# Patient Record
Sex: Female | Born: 1990 | Hispanic: No | Marital: Married | Smoking: Never smoker
Health system: Southern US, Community
[De-identification: ages and names within clinical notes are randomized; demographics above are authoritative.]

## PROBLEM LIST (undated history)

## (undated) DIAGNOSIS — Z789 Other specified health status: Secondary | ICD-10-CM

---
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
## 2008-03-31 ENCOUNTER — Other Ambulatory Visit: Admission: RE | Admit: 2008-03-31 | Discharge: 2008-03-31 | Payer: Self-pay | Admitting: Obstetrics and Gynecology

## 2012-05-16 LAB — HM PAP SMEAR

## 2012-09-25 HISTORY — PX: WISDOM TOOTH EXTRACTION: SHX21

## 2013-01-23 HISTORY — PX: WISDOM TOOTH EXTRACTION: SHX21

## 2013-05-16 ENCOUNTER — Ambulatory Visit: Payer: Self-pay | Admitting: Nurse Practitioner

## 2013-05-23 ENCOUNTER — Encounter: Payer: Self-pay | Admitting: Nurse Practitioner

## 2013-05-23 ENCOUNTER — Ambulatory Visit (INDEPENDENT_AMBULATORY_CARE_PROVIDER_SITE_OTHER): Payer: BC Managed Care – PPO | Admitting: Nurse Practitioner

## 2013-05-23 VITALS — BP 108/66 | HR 84 | Resp 16 | Ht 62.75 in | Wt 111.0 lb

## 2013-05-23 DIAGNOSIS — Z01419 Encounter for gynecological examination (general) (routine) without abnormal findings: Secondary | ICD-10-CM

## 2013-05-23 DIAGNOSIS — Z0000 Encounter for general adult medical examination without abnormal findings: Secondary | ICD-10-CM

## 2013-05-23 NOTE — Progress Notes (Signed)
Patient ID: Alexa Hudson, female   DOB: 1991/01/03, 22 y.o.   MRN: [id number] 22 y.o. G0P0 Married Caucasian Fe here for annual exam. Menses regular lasting 5 days. Got married last October 2013. Not planning pregnancy this year.  Still working as Architect at [employer].  Patient's last menstrual period was 05/07/2013.          Sexually active: yes  The current method of family planning is condoms all the time.    Exercising: no  The patient does not participate in regular exercise at present. Smoker:  no  Health Maintenance: Pap:  05/17/12, WNL TDaP:  01/21/07 Gardasil completed 09/11/08 Labs: HB declined  Urine: negative, 5.0 pH   reports that she has never smoked. She has never used smokeless tobacco. She reports that she does not drink alcohol or use illicit drugs.  History reviewed. No pertinent past medical history.  Past Surgical History  Procedure Laterality Date  . Wisdom tooth extraction      No current outpatient prescriptions on file.   No current facility-administered medications for this visit.    Family History  Problem Relation Age of Onset  . Cancer Other 104    uterine cancer    ROS:  Pertinent items are noted in HPI.  Otherwise, a comprehensive ROS was negative.  Exam:   BP 108/66  Pulse 84  Resp 16  Ht 5' 2.75" (1.594 m)  Wt 111 lb (50.349 kg)  BMI 19.82 kg/m2  LMP 05/07/2013 Height: 5' 2.75" (159.4 cm)  Ht Readings from Last 3 Encounters:  05/23/13 5' 2.75" (1.594 m)  wt: 116 2013 Wt: 124 2012 Wt. 108 2010  General appearance: alert, cooperative and appears stated age Head: Normocephalic, without obvious abnormality, atraumatic Neck: no adenopathy, supple, symmetrical, trachea midline and thyroid normal to inspection and palpation Lungs: clear to auscultation bilaterally Breasts: normal appearance, no masses or tenderness Heart: regular rate and rhythm Abdomen: soft, non-tender; no masses,  no  organomegaly Extremities: extremities normal, atraumatic, no cyanosis or edema Skin: Skin color, texture, turgor normal. No rashes or lesions Lymph nodes: Cervical, supraclavicular, and axillary nodes normal. No abnormal inguinal nodes palpated Neurologic: Grossly normal   Pelvic: External genitalia:  no lesions              Urethra:  normal appearing urethra with no masses, tenderness or lesions              Bartholin's and Skene's: normal                 Vagina: normal appearing vagina with normal color and discharge, no lesions              Cervix: anteverted              Pap taken: no Bimanual Exam:  Uterus:  normal size, contour, position, consistency, mobility, non-tender              Adnexa: no mass, fullness, tenderness               Rectovaginal: Confirms               Anus:  normal sphincter tone, no lesions  A:  Well Woman with normal exam  Condoms for birth control  P:   Pap smear as per guidelines   Counseled on breast self exam, adequate intake of calcium and vitamin D, diet and exercise return annually or prn  An After Visit Summary was printed  and given to the patient.

## 2013-05-23 NOTE — Patient Instructions (Signed)
General topics  Next pap or exam is  due in 1 year Take a Women's multivitamin Take 1200 mg. of calcium daily - prefer dietary If any concerns in interim to call back  Breast Self-Awareness Practicing breast self-awareness may pick up problems early, prevent significant medical complications, and possibly save your life. By practicing breast self-awareness, you can become familiar with how your breasts look and feel and if your breasts are changing. This allows you to notice changes early. It can also offer you some reassurance that your breast health is good. One way to learn what is normal for your breasts and whether your breasts are changing is to do a breast self-exam. If you find a lump or something that was not present in the past, it is best to contact your caregiver right away. Other findings that should be evaluated by your caregiver include nipple discharge, especially if it is bloody; skin changes or reddening; areas where the skin seems to be pulled in (retracted); or new lumps and bumps. Breast pain is seldom associated with cancer (malignancy), but should also be evaluated by a caregiver. BREAST SELF-EXAM The best time to examine your breasts is 5 7 days after your menstrual period is over.  ExitCare Patient Information 2013 [employer].   Exercise to Stay Healthy Exercise helps you become and stay healthy. EXERCISE IDEAS AND TIPS Choose exercises that:  You enjoy.  Fit into your day. You do not need to exercise really hard to be healthy. You can do exercises at a slow or medium level and stay healthy. You can:  Stretch before and after working out.  Try yoga, Pilates, or tai chi.  Lift weights.  Walk fast, swim, jog, run, climb stairs, bicycle, dance, or rollerskate.  Take aerobic classes. Exercises that burn about 150 calories:  Running 1  miles in 15 minutes.  Playing volleyball for 45 to 60 minutes.  Washing and waxing a car for 45 to 60  minutes.  Playing touch football for 45 minutes.  Walking 1  miles in 35 minutes.  Pushing a stroller 1  miles in 30 minutes.  Playing basketball for 30 minutes.  Raking leaves for 30 minutes.  Bicycling 5 miles in 30 minutes.  Walking 2 miles in 30 minutes.  Dancing for 30 minutes.  Shoveling snow for 15 minutes.  Swimming laps for 20 minutes.  Walking up stairs for 15 minutes.  Bicycling 4 miles in 15 minutes.  Gardening for 30 to 45 minutes.  Jumping rope for 15 minutes.  Washing windows or floors for 45 to 60 minutes. Document Released: 10/14/2010 Document Revised: 12/04/2011 Document Reviewed: 10/14/2010 ExitCare Patient Information 2013 [employer].   Other topics ( that may be useful information):    Sexually Transmitted Disease Sexually transmitted disease (STD) refers to any infection that is passed from person to person during sexual activity. This may happen by way of saliva, semen, blood, vaginal mucus, or urine. Common STDs include:  Gonorrhea.  Chlamydia.  Syphilis.  HIV/AIDS.  Genital herpes.  Hepatitis B and C.  Trichomonas.  Human papillomavirus (HPV).  Pubic lice. CAUSES  An STD may be spread by bacteria, virus, or parasite. A person can get an STD by:  Sexual intercourse with an infected person.  Sharing sex toys with an infected person.  Sharing needles with an infected person.  Having intimate contact with the genitals, mouth, or rectal areas of an infected person. SYMPTOMS  Some people may not have any symptoms, but   they can still pass the infection to others. Different STDs have different symptoms. Symptoms include:  Painful or bloody urination.  Pain in the pelvis, abdomen, vagina, anus, throat, or eyes.  Skin rash, itching, irritation, growths, or sores (lesions). These usually occur in the genital or anal area.  Abnormal vaginal discharge.  Penile discharge in men.  Soft, flesh-colored skin growths in the  genital or anal area.  Fever.  Pain or bleeding during sexual intercourse.  Swollen glands in the groin area.  Yellow skin and eyes (jaundice). This is seen with hepatitis. DIAGNOSIS  To make a diagnosis, your caregiver may:  Take a medical history.  Perform a physical exam.  Take a specimen (culture) to be examined.  Examine a sample of discharge under a microscope.  Perform blood test TREATMENT   Chlamydia, gonorrhea, trichomonas, and syphilis can be cured with antibiotic medicine.  Genital herpes, hepatitis, and HIV can be treated, but not cured, with prescribed medicines. The medicines will lessen the symptoms.  Genital warts from HPV can be treated with medicine or by freezing, burning (electrocautery), or surgery. Warts may come back.  HPV is a virus and cannot be cured with medicine or surgery.However, abnormal areas may be followed very closely by your caregiver and may be removed from the cervix, vagina, or vulva through office procedures or surgery. If your diagnosis is confirmed, your recent sexual partners need treatment. This is true even if they are symptom-free or have a negative culture or evaluation. They should not have sex until their caregiver says it is okay. HOME CARE INSTRUCTIONS  All sexual partners should be informed, tested, and treated for all STDs.  Take your antibiotics as directed. Finish them even if you start to feel better.  Only take over-the-counter or prescription medicines for pain, discomfort, or fever as directed by your caregiver.  Rest.  Eat a balanced diet and drink enough fluids to keep your urine clear or pale yellow.  Do not have sex until treatment is completed and you have followed up with your caregiver. STDs should be checked after treatment.  Keep all follow-up appointments, Pap tests, and blood tests as directed by your caregiver.  Only use latex condoms and water-soluble lubricants during sexual activity. Do not use  petroleum jelly or oils.  Avoid alcohol and illegal drugs.  Get vaccinated for HPV and hepatitis. If you have not received these vaccines in the past, talk to your caregiver about whether one or both might be right for you.  Avoid risky sex practices that can break the skin. The only way to avoid getting an STD is to avoid all sexual activity.Latex condoms and dental dams (for oral sex) will help lessen the risk of getting an STD, but will not completely eliminate the risk. SEEK MEDICAL CARE IF:   You have a fever.  You have any new or worsening symptoms. Document Released: 12/02/2002 Document Revised: 12/04/2011 Document Reviewed: 12/09/2010 ExitCare Patient Information 2013 [employer].    Domestic Abuse You are being battered or abused if someone close to you hits, pushes, or physically hurts you in any way. You also are being abused if you are forced into activities. You are being sexually abused if you are forced to have sexual contact of any kind. You are being emotionally abused if you are made to feel worthless or if you are constantly threatened. It is important to remember that help is available. No one has the right to abuse you. PREVENTION OF FURTHER   ABUSE  Learn the warning signs of danger. This varies with situations but may include: the use of alcohol, threats, isolation from friends and family, or forced sexual contact. Leave if you feel that violence is going to occur.  If you are attacked or beaten, report it to the police so the abuse is documented. You do not have to press charges. The police can protect you while you or the attackers are leaving. Get the officer's name and badge number and a copy of the report.  Find someone you can trust and tell them what is happening to you: your caregiver, a nurse, clergy member, close friend or family member. Feeling ashamed is natural, but remember that you have done nothing wrong. No one deserves abuse. Document Released:  09/08/2000 Document Revised: 12/04/2011 Document Reviewed: 11/17/2010 ExitCare Patient Information 2013 [employer].    How Much is Too Much Alcohol? Drinking too much alcohol can cause injury, accidents, and health problems. These types of problems can include:   Car crashes.  Falls.  Family fighting (domestic violence).  Drowning.  Fights.  Injuries.  Burns.  Damage to certain organs.  Having a baby with birth defects. ONE DRINK CAN BE TOO MUCH WHEN YOU ARE:  Working.  Pregnant or breastfeeding.  Taking medicines. Ask your doctor.  Driving or planning to drive. If you or someone you know has a drinking problem, get help from a doctor.  Document Released: 07/08/2009 Document Revised: 12/04/2011 Document Reviewed: 07/08/2009 ExitCare Patient Information 2013 [employer].   Smoking Hazards Smoking cigarettes is extremely bad for your health. Tobacco smoke has over 200 known poisons in it. There are over 60 chemicals in tobacco smoke that cause cancer. Some of the chemicals found in cigarette smoke include:   Cyanide.  Benzene.  Formaldehyde.  Methanol (wood alcohol).  Acetylene (fuel used in welding torches).  Ammonia. Cigarette smoke also contains the poisonous gases nitrogen oxide and carbon monoxide.  Cigarette smokers have an increased risk of many serious medical problems and Smoking causes approximately:  90% of all lung cancer deaths in men.  80% of all lung cancer deaths in women.  90% of deaths from chronic obstructive lung disease. Compared with nonsmokers, smoking increases the risk of:  Coronary heart disease by 2 to 4 times.  Stroke by 2 to 4 times.  Men developing lung cancer by 23 times.  Women developing lung cancer by 13 times.  Dying from chronic obstructive lung diseases by 12 times.  . Smoking is the most preventable cause of death and disease in our society.  WHY IS SMOKING ADDICTIVE?  Nicotine is the chemical  agent in tobacco that is capable of causing addiction or dependence.  When you smoke and inhale, nicotine is absorbed rapidly into the bloodstream through your lungs. Nicotine absorbed through the lungs is capable of creating a powerful addiction. Both inhaled and non-inhaled nicotine may be addictive.  Addiction studies of cigarettes and spit tobacco show that addiction to nicotine occurs mainly during the teen years, when young people begin using tobacco products. WHAT ARE THE BENEFITS OF QUITTING?  There are many health benefits to quitting smoking.   Likelihood of developing cancer and heart disease decreases. Health improvements are seen almost immediately.  Blood pressure, pulse rate, and breathing patterns start returning to normal soon after quitting. QUITTING SMOKING   American Lung Association - 1-800-LUNGUSA  American Cancer Society - 1-800-ACS-2345 Document Released: 10/19/2004 Document Revised: 12/04/2011 Document Reviewed: 06/23/2009 ExitCare Patient Information 2013 [employer].   Stress Management Stress is a state of physical or mental tension that often results from changes in your life or normal routine. Some common causes of stress are:  Death of a loved one.  Injuries or severe illnesses.  Getting fired or changing jobs.  Moving into a new home. Other causes may be:  Sexual problems.  Business or financial losses.  Taking on a large debt.  Regular conflict with someone at home or at work.  Constant tiredness from lack of sleep. It is not just bad things that are stressful. It may be stressful to:  Win the lottery.  Get married.  Buy a new car. The amount of stress that can be easily tolerated varies from person to person. Changes generally cause stress, regardless of the types of change. Too much stress can affect your health. It may lead to physical or emotional problems. Too little stress (boredom) may also become stressful. SUGGESTIONS TO  REDUCE STRESS:  Talk things over with your family and friends. It often is helpful to share your concerns and worries. If you feel your problem is serious, you may want to get help from a professional counselor.  Consider your problems one at a time instead of lumping them all together. Trying to take care of everything at once may seem impossible. List all the things you need to do and then start with the most important one. Set a goal to accomplish 2 or 3 things each day. If you expect to do too many in a single day you will naturally fail, causing you to feel even more stressed.  Do not use alcohol or drugs to relieve stress. Although you may feel better for a short time, they do not remove the problems that caused the stress. They can also be habit forming.  Exercise regularly - at least 3 times per week. Physical exercise can help to relieve that "uptight" feeling and will relax you.  The shortest distance between despair and hope is often a good night's sleep.  Go to bed and get up on time allowing yourself time for appointments without being rushed.  Take a short "time-out" period from any stressful situation that occurs during the day. Close your eyes and take some deep breaths. Starting with the muscles in your face, tense them, hold it for a few seconds, then relax. Repeat this with the muscles in your neck, shoulders, hand, stomach, back and legs.  Take good care of yourself. Eat a balanced diet and get plenty of rest.  Schedule time for having fun. Take a break from your daily routine to relax. HOME CARE INSTRUCTIONS   Call if you feel overwhelmed by your problems and feel you can no longer manage them on your own.  Return immediately if you feel like hurting yourself or someone else. Document Released: 03/07/2001 Document Revised: 12/04/2011 Document Reviewed: 10/28/2007 ExitCare Patient Information 2013 [employer].   

## 2013-05-26 NOTE — Progress Notes (Signed)
Encounter reviewed by Dr. Sharissa Brierley Silva.  

## 2013-11-12 ENCOUNTER — Telehealth: Payer: Self-pay | Admitting: Nurse Practitioner

## 2013-11-12 ENCOUNTER — Encounter: Payer: Self-pay | Admitting: Certified Nurse Midwife

## 2013-11-12 ENCOUNTER — Ambulatory Visit (INDEPENDENT_AMBULATORY_CARE_PROVIDER_SITE_OTHER): Payer: BC Managed Care – PPO | Admitting: Certified Nurse Midwife

## 2013-11-12 VITALS — BP 116/78 | HR 82 | Temp 98.3°F | Resp 16 | Ht 62.75 in | Wt 111.0 lb

## 2013-11-12 DIAGNOSIS — B3731 Acute candidiasis of vulva and vagina: Secondary | ICD-10-CM

## 2013-11-12 DIAGNOSIS — N390 Urinary tract infection, site not specified: Secondary | ICD-10-CM

## 2013-11-12 DIAGNOSIS — B373 Candidiasis of vulva and vagina: Secondary | ICD-10-CM

## 2013-11-12 LAB — POCT URINALYSIS DIPSTICK
BILIRUBIN UA: NEGATIVE
Glucose, UA: NEGATIVE
KETONES UA: NEGATIVE
Nitrite, UA: NEGATIVE
PH UA: 5
Protein, UA: NEGATIVE
RBC UA: NEGATIVE
Urobilinogen, UA: NEGATIVE

## 2013-11-12 MED ORDER — FLUCONAZOLE 150 MG PO TABS: 150.0000 mg | ORAL_TABLET | Freq: Once | ORAL | Status: DC

## 2013-11-12 MED ORDER — NITROFURANTOIN MONOHYD MACRO 100 MG PO CAPS: 100.0000 mg | ORAL_CAPSULE | Freq: Two times a day (BID) | ORAL | Status: DC

## 2013-11-12 NOTE — Progress Notes (Signed)
   Subjective:    Patient ID: Alexa Hudson, white female [id number] DOB: [birth year], 23 y.o.   MRN: [id number]  Urinary Tract Infection  This is a new problem. The current episode started in the past 7 days. The problem occurs intermittently. The quality of the pain is described as shooting. The pain is mild. There has been no fever. She is sexually active. There is no history of pyelonephritis. Associated symptoms include frequency and urgency. Pertinent negatives include no chills.   Patient here with complaint of urinary frequency, urgency and pain with urination.Patient also has used new detergent in past few days. She also wears thongs most of the time. No relationship of symptoms to coitus. Patient has also noted increase of vaginal discharge, no odor, slight irritation.    Review of Systems  Constitutional: Negative.  Negative for chills.  Genitourinary: Positive for dysuria, urgency, frequency and vaginal discharge.       Objective:   Physical Exam  Constitutional: She is oriented to person, place, and time. She appears well-developed and well-nourished.  Pulmonary/Chest:  Negative CVAT bilateral   Abdominal:  Abdomen: slight suprapubic tenderness  Genitourinary: Uterus normal. There is no rash or tenderness on the right labia. There is no rash or tenderness on the left labia. Uterus is not tender. Cervix exhibits no motion tenderness and no discharge. Right adnexum displays no mass and no tenderness. Left adnexum displays no mass and no tenderness. No tenderness around the vagina. Vaginal discharge found.  Bladder, urethral meatus and urethra tender Vagina: thick white discharge noted. Ph 4.0 Wet prep taken  Wet prep positive for yeast  Neurological: She is alert and oriented to person, place, and time.  Skin: Skin is warm and dry.  Psychiatric: She has a normal mood and affect. Judgment normal.          Assessment & Plan:  1-UTI 2- Yeast vaginitis  P: Reviewed findings  of UTI and yeast vaginitis. Rx Macrobid see order Rx Diflucan see order Discussed importance of increased water intake, decrease or stop thong use which can contribute to both problems. Lab: Urine micro/culture  Rv prn

## 2013-11-12 NOTE — Patient Instructions (Signed)
Monilial Vaginitis Vaginitis in a soreness, swelling and redness (inflammation) of the vagina and vulva. Monilial vaginitis is not a sexually transmitted infection. CAUSES  Yeast vaginitis is caused by yeast (candida) that is normally found in your vagina. With a yeast infection, the candida has overgrown in number to a point that upsets the chemical balance. SYMPTOMS   White, thick vaginal discharge.  Swelling, itching, redness and irritation of the vagina and possibly the lips of the vagina (vulva).  Burning or painful urination.  Painful intercourse. DIAGNOSIS  Things that may contribute to monilial vaginitis are:  Postmenopausal and virginal states.  Pregnancy.  Infections.  Being tired, sick or stressed, especially if you had monilial vaginitis in the past.  Diabetes. Good control will help lower the chance.  Birth control pills.  Tight fitting garments.  Using bubble bath, feminine sprays, douches or deodorant tampons.  Taking certain medications that kill germs (antibiotics).  Sporadic recurrence can occur if you become ill. TREATMENT  Your caregiver will give you medication.  There are several kinds of anti monilial vaginal creams and suppositories specific for monilial vaginitis. For recurrent yeast infections, use a suppository or cream in the vagina 2 times a week, or as directed.  Anti-monilial or steroid cream for the itching or irritation of the vulva may also be used. Get your caregiver's permission.  Painting the vagina with methylene blue solution may help if the monilial cream does not work.  Eating yogurt may help prevent monilial vaginitis. HOME CARE INSTRUCTIONS   Finish all medication as prescribed.  Do not have sex until treatment is completed or after your caregiver tells you it is okay.  Take warm sitz baths.  Do not douche.  Do not use tampons, especially scented ones.  Wear cotton underwear.  Avoid tight pants and panty  hose.  Tell your sexual partner that you have a yeast infection. They should go to their caregiver if they have symptoms such as mild rash or itching.  Your sexual partner should be treated as well if your infection is difficult to eliminate.  Practice safer sex. Use condoms.  Some vaginal medications cause latex condoms to fail. Vaginal medications that harm condoms are:  Cleocin cream.  Butoconazole (Femstat).  Terconazole (Terazol) vaginal suppository.  Miconazole (Monistat) (may be purchased over the counter). SEEK MEDICAL CARE IF:   You have a temperature by mouth above 102 F (38.9 C).  The infection is getting worse after 2 days of treatment.  The infection is not getting better after 3 days of treatment.  You develop blisters in or around your vagina.  You develop vaginal bleeding, and it is not your menstrual period.  You have pain when you urinate.  You develop intestinal problems.  You have pain with sexual intercourse. Document Released: 06/21/2005 Document Revised: 12/04/2011 Document Reviewed: 03/05/2009 Exit[employer] Patient Information 2014 [employer].   Urinary Tract Infection Urinary tract infections (UTIs) can develop anywhere along your urinary tract. Your urinary tract is your body's drainage system for removing wastes and extra water. Your urinary tract includes two kidneys, two ureters, a bladder, and a urethra. Your kidneys are a pair of bean-shaped organs. Each kidney is about the size of your fist. They are located below your ribs, one on each side of your spine. CAUSES Infections are caused by microbes, which are microscopic organisms, including fungi, viruses, and bacteria. These organisms are so small that they can only be seen through a microscope. Bacteria are the microbes that most   commonly cause UTIs. SYMPTOMS  Symptoms of UTIs may vary by age and gender of the patient and by the location of the infection. Symptoms in young women  typically include a frequent and intense urge to urinate and a painful, burning feeling in the bladder or urethra during urination. Older women and men are more likely to be tired, shaky, and weak and have muscle aches and abdominal pain. A fever may mean the infection is in your kidneys. Other symptoms of a kidney infection include pain in your back or sides below the ribs, nausea, and vomiting. DIAGNOSIS To diagnose a UTI, your caregiver will ask you about your symptoms. Your caregiver also will ask to provide a urine sample. The urine sample will be tested for bacteria and white blood cells. White blood cells are made by your body to help fight infection. TREATMENT  Typically, UTIs can be treated with medication. Because most UTIs are caused by a bacterial infection, they usually can be treated with the use of antibiotics. The choice of antibiotic and length of treatment depend on your symptoms and the type of bacteria causing your infection. HOME CARE INSTRUCTIONS  If you were prescribed antibiotics, take them exactly as your caregiver instructs you. Finish the medication even if you feel better after you have only taken some of the medication.  Drink enough water and fluids to keep your urine clear or pale yellow.  Avoid caffeine, tea, and carbonated beverages. They tend to irritate your bladder.  Empty your bladder often. Avoid holding urine for long periods of time.  Empty your bladder before and after sexual intercourse.  After a bowel movement, women should cleanse from front to back. Use each tissue only once. SEEK MEDICAL CARE IF:   You have back pain.  You develop a fever.  Your symptoms do not begin to resolve within 3 days. SEEK IMMEDIATE MEDICAL CARE IF:   You have severe back pain or lower abdominal pain.  You develop chills.  You have nausea or vomiting.  You have continued burning or discomfort with urination. MAKE SURE YOU:   Understand these  instructions.  Will watch your condition.  Will get help right away if you are not doing well or get worse. Document Released: 06/21/2005 Document Revised: 03/12/2012 Document Reviewed: 10/20/2011 Exit[employer] Patient Information 2014 [employer].  

## 2013-11-12 NOTE — Telephone Encounter (Signed)
Pt  Has a UTI and can only come in at 4:45 tomorrow.

## 2013-11-12 NOTE — Telephone Encounter (Signed)
Message left to return call to [facility] at [phone] at number requested [phone].

## 2013-11-12 NOTE — Telephone Encounter (Signed)
Agree  encounter closed 

## 2013-11-12 NOTE — Telephone Encounter (Signed)
Please call pt at [phone]

## 2013-11-12 NOTE — Telephone Encounter (Signed)
Patient c/o dysuria x 2 days. States "I think I have a bladder infection" Denies fevers or flank pain. States this feels like prior uti that she has before. + intermittent chills.  Scheduled for office visit, patient requests as late as possible for work obligations. Agreeable to 1600 today with Verner Choleborah S. Leonard CNM Routing to provider for final review. Patient agreeable to disposition. Will close encounter

## 2013-11-13 LAB — URINALYSIS, MICROSCOPIC ONLY
CASTS: NONE SEEN
CRYSTALS: NONE SEEN
SQUAMOUS EPITHELIAL / LPF: NONE SEEN

## 2013-11-14 LAB — URINE CULTURE: Colony Count: >=100000

## 2013-11-14 NOTE — Progress Notes (Signed)
Reviewed personally.  M. Suzanne Donnell Beauchamp, MD.  

## 2013-11-17 ENCOUNTER — Telehealth: Payer: Self-pay | Admitting: *Deleted

## 2013-11-17 DIAGNOSIS — N390 Urinary tract infection, site not specified: Secondary | ICD-10-CM

## 2013-11-17 NOTE — Telephone Encounter (Signed)
I have attempted to contact this patient by phone with the following results: left message to return my call on answering machine (home).  [facility]

## 2013-11-17 NOTE — Telephone Encounter (Signed)
I have attempted to contact this patient by phone with the following results: left message to return my call on answering machine (mobile).  

## 2013-11-17 NOTE — Telephone Encounter (Signed)
Patient is calling stephanie back °

## 2013-11-17 NOTE — Telephone Encounter (Signed)
Message copied by Luisa DagoPHILLIPS, STEPHANIE C on Mon Nov 17, 2013  9:55 AM ------      Message from: Ria CommentGRUBB, PATRICIA R      Created: Fri Nov 14, 2013  2:20 PM       Let  patient know that MS. Eunice BlaseDebbie does have UTI with E Coli her on the correct meds and she needs TOC in 2 weeks.  Nurse visit OK ------

## 2013-11-17 NOTE — Telephone Encounter (Signed)
Pt notified in result note.  appt scheduled.

## 2013-12-02 ENCOUNTER — Ambulatory Visit (INDEPENDENT_AMBULATORY_CARE_PROVIDER_SITE_OTHER): Payer: BC Managed Care – PPO | Admitting: *Deleted

## 2013-12-02 VITALS — BP 113/83 | HR 72 | Resp 18 | Wt 112.0 lb

## 2013-12-02 DIAGNOSIS — N390 Urinary tract infection, site not specified: Secondary | ICD-10-CM

## 2013-12-02 NOTE — Progress Notes (Signed)
Patient in for TOC. She states she finish her Abx about 2 weeks ago and has no other sx or concerns. -Patient is on menstrual cycle.

## 2013-12-03 LAB — URINE CULTURE
Colony Count: NO GROWTH
ORGANISM ID, BACTERIA: NO GROWTH

## 2014-05-25 ENCOUNTER — Ambulatory Visit (INDEPENDENT_AMBULATORY_CARE_PROVIDER_SITE_OTHER): Payer: BC Managed Care – PPO | Admitting: Nurse Practitioner

## 2014-05-25 ENCOUNTER — Encounter: Payer: Self-pay | Admitting: Nurse Practitioner

## 2014-05-25 VITALS — BP 106/60 | HR 64 | Ht 63.0 in | Wt 116.0 lb

## 2014-05-25 DIAGNOSIS — Z01419 Encounter for gynecological examination (general) (routine) without abnormal findings: Secondary | ICD-10-CM

## 2014-05-25 DIAGNOSIS — Z0000 Encounter for general adult medical examination without abnormal findings: Secondary | ICD-10-CM

## 2014-05-25 LAB — POCT URINALYSIS DIPSTICK
BILIRUBIN UA: NEGATIVE
Blood, UA: NEGATIVE
GLUCOSE UA: NEGATIVE
Ketones, UA: NEGATIVE
Leukocytes, UA: NEGATIVE
Nitrite, UA: NEGATIVE
Protein, UA: NEGATIVE
UROBILINOGEN UA: NEGATIVE
pH, UA: 6

## 2014-05-25 LAB — HEMOGLOBIN, FINGERSTICK: Hemoglobin, fingerstick: 13.9 g/dL (ref 12.0–16.0)

## 2014-05-25 NOTE — Progress Notes (Signed)
Encounter reviewed by Dr. Alexine Pilant Silva.  

## 2014-05-25 NOTE — Progress Notes (Signed)
Patient ID: Alexa Hudson, female   DOB: 1991-03-23, 23 y.o.   MRN: [id number] 23 y.o. G0P0 Married Caucasian Fe here for annual exam.  Married X 2 years and not planning for 2 years.  Menses now at 5 days. Flow moderate to light.  Cramps x 1 day - does not need NSAID's.  Patient's last menstrual period was 05/06/2014.          Sexually active: yes  The current method of family planning is condoms all the time.  Exercising: yes, at home exercise and gym about 4 times per week Smoker: no   Health Maintenance:  Pap: 05/17/12, WNL  TDaP: 01/21/07  Gardasil completed 09/11/08  Labs:  HB:  13.9   Urine:  Negative    reports that she has never smoked. She has never used smokeless tobacco. She reports that she does not drink alcohol or use illicit drugs.  History reviewed. No pertinent past medical history.  Past Surgical History  Procedure Laterality Date  . Wisdom tooth extraction  01/2013    No current outpatient prescriptions on file.   No current facility-administered medications for this visit.    Family History  Problem Relation Age of Onset  . Cancer Other 104    colon    ROS:  Pertinent items are noted in HPI.  Otherwise, a comprehensive ROS was negative.  Exam:   BP 106/60  Pulse 64  Ht  (1.6 m)  Wt 116 lb (52.617 kg)  BMI 20.55 kg/m2  LMP 05/06/2014 Height:  (160 cm)  Ht Readings from Last 3 Encounters:  05/25/14  (1.6 m)  11/12/13 5' 2.75" (1.594 m)  05/23/13 5' 2.75" (1.594 m)    General appearance: alert, cooperative and appears stated age Head: Normocephalic, without obvious abnormality, atraumatic Neck: no adenopathy, supple, symmetrical, trachea midline and thyroid normal to inspection and palpation Lungs: clear to auscultation bilaterally Breasts: normal appearance, no masses or tenderness Heart: regular rate and rhythm Abdomen: soft, non-tender; no masses,  no organomegaly Extremities: extremities normal, atraumatic, no cyanosis or  edema Skin: Skin color, texture, turgor normal. No rashes or lesions Lymph nodes: Cervical, supraclavicular, and axillary nodes normal. No abnormal inguinal nodes palpated Neurologic: Grossly normal   Pelvic: External genitalia:  no lesions              Urethra:  normal appearing urethra with no masses, tenderness or lesions              Bartholin's and Skene's: normal                 Vagina: normal appearing vagina with normal color and discharge, no lesions              Cervix: anteverted              Pap taken: Yes.   Bimanual Exam:  Uterus:  normal size, contour, position, consistency, mobility, non-tender              Adnexa: no mass, fullness, tenderness               Rectovaginal: Confirms               Anus:  normal sphincter tone, no lesions  A:  Well Woman with normal exam  Condoms for birth control  P:   Reviewed health and wellness pertinent to exam  Pap smear taken today  Will check on immunizations records  Counseled on breast self exam,  adequate intake of calcium and vitamin D, diet and exercise return annually or prn  An After Visit Summary was printed and given to the patient.

## 2014-05-25 NOTE — Patient Instructions (Signed)
General topics  Next pap or exam is  due in 1 year Take a Women's multivitamin Take 1200 mg. of calcium daily - prefer dietary If any concerns in interim to call back  Breast Self-Awareness Practicing breast self-awareness may pick up problems early, prevent significant medical complications, and possibly save your life. By practicing breast self-awareness, you can become familiar with how your breasts look and feel and if your breasts are changing. This allows you to notice changes early. It can also offer you some reassurance that your breast health is good. One way to learn what is normal for your breasts and whether your breasts are changing is to do a breast self-exam. If you find a lump or something that was not present in the past, it is best to contact your caregiver right away. Other findings that should be evaluated by your caregiver include nipple discharge, especially if it is bloody; skin changes or reddening; areas where the skin seems to be pulled in (retracted); or new lumps and bumps. Breast pain is seldom associated with cancer (malignancy), but should also be evaluated by a caregiver. BREAST SELF-EXAM The best time to examine your breasts is 5 7 days after your menstrual period is over.  ExitCare Patient Information 2013 [employer].   Exercise to Stay Healthy Exercise helps you become and stay healthy. EXERCISE IDEAS AND TIPS Choose exercises that:  You enjoy.  Fit into your day. You do not need to exercise really hard to be healthy. You can do exercises at a slow or medium level and stay healthy. You can:  Stretch before and after working out.  Try yoga, Pilates, or tai chi.  Lift weights.  Walk fast, swim, jog, run, climb stairs, bicycle, dance, or rollerskate.  Take aerobic classes. Exercises that burn about 150 calories:  Running 1  miles in 15 minutes.  Playing volleyball for 45 to 60 minutes.  Washing and waxing a car for 45 to 60  minutes.  Playing touch football for 45 minutes.  Walking 1  miles in 35 minutes.  Pushing a stroller 1  miles in 30 minutes.  Playing basketball for 30 minutes.  Raking leaves for 30 minutes.  Bicycling 5 miles in 30 minutes.  Walking 2 miles in 30 minutes.  Dancing for 30 minutes.  Shoveling snow for 15 minutes.  Swimming laps for 20 minutes.  Walking up stairs for 15 minutes.  Bicycling 4 miles in 15 minutes.  Gardening for 30 to 45 minutes.  Jumping rope for 15 minutes.  Washing windows or floors for 45 to 60 minutes. Document Released: 10/14/2010 Document Revised: 12/04/2011 Document Reviewed: 10/14/2010 ExitCare Patient Information 2013 [employer].   Other topics ( that may be useful information):    Sexually Transmitted Disease Sexually transmitted disease (STD) refers to any infection that is passed from person to person during sexual activity. This may happen by way of saliva, semen, blood, vaginal mucus, or urine. Common STDs include:  Gonorrhea.  Chlamydia.  Syphilis.  HIV/AIDS.  Genital herpes.  Hepatitis B and C.  Trichomonas.  Human papillomavirus (HPV).  Pubic lice. CAUSES  An STD may be spread by bacteria, virus, or parasite. A person can get an STD by:  Sexual intercourse with an infected person.  Sharing sex toys with an infected person.  Sharing needles with an infected person.  Having intimate contact with the genitals, mouth, or rectal areas of an infected person. SYMPTOMS  Some people may not have any symptoms, but   they can still pass the infection to others. Different STDs have different symptoms. Symptoms include:  Painful or bloody urination.  Pain in the pelvis, abdomen, vagina, anus, throat, or eyes.  Skin rash, itching, irritation, growths, or sores (lesions). These usually occur in the genital or anal area.  Abnormal vaginal discharge.  Penile discharge in men.  Soft, flesh-colored skin growths in the  genital or anal area.  Fever.  Pain or bleeding during sexual intercourse.  Swollen glands in the groin area.  Yellow skin and eyes (jaundice). This is seen with hepatitis. DIAGNOSIS  To make a diagnosis, your caregiver may:  Take a medical history.  Perform a physical exam.  Take a specimen (culture) to be examined.  Examine a sample of discharge under a microscope.  Perform blood test TREATMENT   Chlamydia, gonorrhea, trichomonas, and syphilis can be cured with antibiotic medicine.  Genital herpes, hepatitis, and HIV can be treated, but not cured, with prescribed medicines. The medicines will lessen the symptoms.  Genital warts from HPV can be treated with medicine or by freezing, burning (electrocautery), or surgery. Warts may come back.  HPV is a virus and cannot be cured with medicine or surgery.However, abnormal areas may be followed very closely by your caregiver and may be removed from the cervix, vagina, or vulva through office procedures or surgery. If your diagnosis is confirmed, your recent sexual partners need treatment. This is true even if they are symptom-free or have a negative culture or evaluation. They should not have sex until their caregiver says it is okay. HOME CARE INSTRUCTIONS  All sexual partners should be informed, tested, and treated for all STDs.  Take your antibiotics as directed. Finish them even if you start to feel better.  Only take over-the-counter or prescription medicines for pain, discomfort, or fever as directed by your caregiver.  Rest.  Eat a balanced diet and drink enough fluids to keep your urine clear or pale yellow.  Do not have sex until treatment is completed and you have followed up with your caregiver. STDs should be checked after treatment.  Keep all follow-up appointments, Pap tests, and blood tests as directed by your caregiver.  Only use latex condoms and water-soluble lubricants during sexual activity. Do not use  petroleum jelly or oils.  Avoid alcohol and illegal drugs.  Get vaccinated for HPV and hepatitis. If you have not received these vaccines in the past, talk to your caregiver about whether one or both might be right for you.  Avoid risky sex practices that can break the skin. The only way to avoid getting an STD is to avoid all sexual activity.Latex condoms and dental dams (for oral sex) will help lessen the risk of getting an STD, but will not completely eliminate the risk. SEEK MEDICAL CARE IF:   You have a fever.  You have any new or worsening symptoms. Document Released: 12/02/2002 Document Revised: 12/04/2011 Document Reviewed: 12/09/2010 [facility] Patient Information 2013 [place].    Domestic Abuse You are being battered or abused if someone close to you hits, pushes, or physically hurts you in any way. You also are being abused if you are forced into activities. You are being sexually abused if you are forced to have sexual contact of any kind. You are being emotionally abused if you are made to feel worthless or if you are constantly threatened. It is important to remember that help is available. No one has the right to abuse you. PREVENTION OF FURTHER  ABUSE  Learn the warning signs of danger. This varies with situations but may include: the use of alcohol, threats, isolation from friends and family, or forced sexual contact. Leave if you feel that violence is going to occur.  If you are attacked or beaten, report it to the police so the abuse is documented. You do not have to press charges. The police can protect you while you or the attackers are leaving. Get the officer's name and badge number and a copy of the report.  Find someone you can trust and tell them what is happening to you: your caregiver, a nurse, clergy member, close friend or family member. Feeling ashamed is natural, but remember that you have done nothing wrong. No one deserves abuse. Document Released:  09/08/2000 Document Revised: 12/04/2011 Document Reviewed: 11/17/2010 [facility] Patient Information 2013 [place].    How Much is Too Much Alcohol? Drinking too much alcohol can cause injury, accidents, and health problems. These types of problems can include:   Car crashes.  Falls.  Family fighting (domestic violence).  Drowning.  Fights.  Injuries.  Burns.  Damage to certain organs.  Having a baby with birth defects. ONE DRINK CAN BE TOO MUCH WHEN YOU ARE:  Working.  Pregnant or breastfeeding.  Taking medicines. Ask your doctor.  Driving or planning to drive. If you or someone you know has a drinking problem, get help from a doctor.  Document Released: 07/08/2009 Document Revised: 12/04/2011 Document Reviewed: 07/08/2009 [facility] Patient Information 2013 [place].   Smoking Hazards Smoking cigarettes is extremely bad for your health. Tobacco smoke has over 200 known poisons in it. There are over 60 chemicals in tobacco smoke that cause cancer. Some of the chemicals found in cigarette smoke include:   Cyanide.  Benzene.  Formaldehyde.  Methanol (wood alcohol).  Acetylene (fuel used in welding torches).  Ammonia. Cigarette smoke also contains the poisonous gases nitrogen oxide and carbon monoxide.  Cigarette smokers have an increased risk of many serious medical problems and Smoking causes approximately:  90% of all lung cancer deaths in men.  80% of all lung cancer deaths in women.  90% of deaths from chronic obstructive lung disease. Compared with nonsmokers, smoking increases the risk of:  Coronary heart disease by 2 to 4 times.  Stroke by 2 to 4 times.  Men developing lung cancer by 23 times.  Women developing lung cancer by 13 times.  Dying from chronic obstructive lung diseases by 12 times.  . Smoking is the most preventable cause of death and disease in our society.  WHY IS SMOKING ADDICTIVE?  Nicotine is the chemical  agent in tobacco that is capable of causing addiction or dependence.  When you smoke and inhale, nicotine is absorbed rapidly into the bloodstream through your lungs. Nicotine absorbed through the lungs is capable of creating a powerful addiction. Both inhaled and non-inhaled nicotine may be addictive.  Addiction studies of cigarettes and spit tobacco show that addiction to nicotine occurs mainly during the teen years, when young people begin using tobacco products. WHAT ARE THE BENEFITS OF QUITTING?  There are many health benefits to quitting smoking.   Likelihood of developing cancer and heart disease decreases. Health improvements are seen almost immediately.  Blood pressure, pulse rate, and breathing patterns start returning to normal soon after quitting. QUITTING SMOKING   American Lung Association - 1-800-LUNGUSA  American Cancer Society - 1-800-ACS-2345 Document Released: 10/19/2004 Document Revised: 12/04/2011 Document Reviewed: 06/23/2009 [facility] Patient Information 2013 [place].   Stress Management Stress is a state of physical or mental tension that often results from changes in your life or normal routine. Some common causes of stress are:  Death of a loved one.  Injuries or severe illnesses.  Getting fired or changing jobs.  Moving into a new home. Other causes may be:  Sexual problems.  Business or financial losses.  Taking on a large debt.  Regular conflict with someone at home or at work.  Constant tiredness from lack of sleep. It is not just bad things that are stressful. It may be stressful to:  Win the lottery.  Get married.  Buy a new car. The amount of stress that can be easily tolerated varies from person to person. Changes generally cause stress, regardless of the types of change. Too much stress can affect your health. It may lead to physical or emotional problems. Too little stress (boredom) may also become stressful. SUGGESTIONS TO  REDUCE STRESS:  Talk things over with your family and friends. It often is helpful to share your concerns and worries. If you feel your problem is serious, you may want to get help from a professional counselor.  Consider your problems one at a time instead of lumping them all together. Trying to take care of everything at once may seem impossible. List all the things you need to do and then start with the most important one. Set a goal to accomplish 2 or 3 things each day. If you expect to do too many in a single day you will naturally fail, causing you to feel even more stressed.  Do not use alcohol or drugs to relieve stress. Although you may feel better for a short time, they do not remove the problems that caused the stress. They can also be habit forming.  Exercise regularly - at least 3 times per week. Physical exercise can help to relieve that "uptight" feeling and will relax you.  The shortest distance between despair and hope is often a good night's sleep.  Go to bed and get up on time allowing yourself time for appointments without being rushed.  Take a short "time-out" period from any stressful situation that occurs during the day. Close your eyes and take some deep breaths. Starting with the muscles in your face, tense them, hold it for a few seconds, then relax. Repeat this with the muscles in your neck, shoulders, hand, stomach, back and legs.  Take good care of yourself. Eat a balanced diet and get plenty of rest.  Schedule time for having fun. Take a break from your daily routine to relax. HOME CARE INSTRUCTIONS   Call if you feel overwhelmed by your problems and feel you can no longer manage them on your own.  Return immediately if you feel like hurting yourself or someone else. Document Released: 03/07/2001 Document Revised: 12/04/2011 Document Reviewed: 10/28/2007 [facility] Patient Information 2013 [place].   Get record for MMR II

## 2014-05-27 LAB — IPS PAP TEST WITH REFLEX TO HPV

## 2015-01-21 ENCOUNTER — Telehealth: Payer: Self-pay | Admitting: Nurse Practitioner

## 2015-01-21 NOTE — Telephone Encounter (Signed)
Left message to call regarding the amount for records if she wish to get all. $31.75 or can mail the last 2 visits and labs for free.

## 2015-01-27 DIAGNOSIS — Z0289 Encounter for other administrative examinations: Secondary | ICD-10-CM

## 2015-05-27 ENCOUNTER — Ambulatory Visit: Payer: Self-pay | Admitting: Nurse Practitioner

## 2017-04-16 ENCOUNTER — Other Ambulatory Visit (HOSPITAL_COMMUNITY): Payer: Self-pay | Admitting: Obstetrics and Gynecology

## 2017-04-16 DIAGNOSIS — N979 Female infertility, unspecified: Secondary | ICD-10-CM

## 2017-04-20 ENCOUNTER — Ambulatory Visit (HOSPITAL_COMMUNITY)
Admission: RE | Admit: 2017-04-20 | Discharge: 2017-04-20 | Disposition: A | Discharge status: Home / Self Care | Payer: BLUE CROSS/BLUE SHIELD | Source: Ambulatory Visit | Attending: Obstetrics and Gynecology | Admitting: Obstetrics and Gynecology

## 2017-04-20 DIAGNOSIS — N979 Female infertility, unspecified: Secondary | ICD-10-CM | POA: Diagnosis not present

## 2017-04-20 MED ORDER — IOPAMIDOL (ISOVUE-300) INJECTION 61%
30.0000 mL | Freq: Once | INTRAVENOUS | Status: AC | PRN
  Administered 2017-04-20: 3 mL

## 2017-12-26 LAB — OB RESULTS CONSOLE RUBELLA ANTIBODY, IGM: RUBELLA: IMMUNE

## 2017-12-26 LAB — OB RESULTS CONSOLE ABO/RH: RH TYPE: POSITIVE

## 2018-01-21 LAB — OB RESULTS CONSOLE GC/CHLAMYDIA
Chlamydia: NEGATIVE
Gonorrhea: NEGATIVE

## 2018-01-21 LAB — OB RESULTS CONSOLE HIV ANTIBODY (ROUTINE TESTING): HIV: NONREACTIVE

## 2018-01-21 LAB — OB RESULTS CONSOLE HEPATITIS B SURFACE ANTIGEN: HEP B S AG: NEGATIVE

## 2018-04-15 LAB — OB RESULTS CONSOLE RPR: RPR: NONREACTIVE

## 2018-06-13 LAB — OB RESULTS CONSOLE GBS: GBS: NEGATIVE

## 2018-07-16 ENCOUNTER — Other Ambulatory Visit: Payer: Self-pay | Admitting: Obstetrics and Gynecology

## 2018-07-24 ENCOUNTER — Inpatient Hospital Stay (HOSPITAL_COMMUNITY): Payer: BLUE CROSS/BLUE SHIELD | Admitting: Anesthesiology

## 2018-07-24 ENCOUNTER — Other Ambulatory Visit: Payer: Self-pay

## 2018-07-24 ENCOUNTER — Encounter (HOSPITAL_COMMUNITY): Payer: Self-pay

## 2018-07-24 ENCOUNTER — Inpatient Hospital Stay (HOSPITAL_COMMUNITY)
Admission: AD | Admit: 2018-07-24 | Discharge: 2018-07-27 | DRG: 788 | Disposition: A | Discharge status: Home / Self Care | Payer: BLUE CROSS/BLUE SHIELD | Attending: Obstetrics and Gynecology | Admitting: Obstetrics and Gynecology

## 2018-07-24 DIAGNOSIS — O480 Post-term pregnancy: Secondary | ICD-10-CM | POA: Diagnosis present

## 2018-07-24 DIAGNOSIS — Z3A41 41 weeks gestation of pregnancy: Secondary | ICD-10-CM | POA: Diagnosis not present

## 2018-07-24 DIAGNOSIS — Z98891 History of uterine scar from previous surgery: Secondary | ICD-10-CM

## 2018-07-24 LAB — ABO/RH: ABO/RH(D): B POS

## 2018-07-24 LAB — CBC
HCT: 35.9 % — ABNORMAL LOW (ref 36.0–46.0)
Hemoglobin: 12.8 g/dL (ref 12.0–15.0)
MCH: 30.6 pg (ref 26.0–34.0)
MCHC: 35.7 g/dL (ref 30.0–36.0)
MCV: 85.9 fL (ref 80.0–100.0)
NRBC: 0 % (ref 0.0–0.2)
PLATELETS: 248 10*3/uL (ref 150–400)
RBC: 4.18 MIL/uL (ref 3.87–5.11)
RDW: 13.8 % (ref 11.5–15.5)
WBC: 10.4 10*3/uL (ref 4.0–10.5)

## 2018-07-24 LAB — TYPE AND SCREEN
ABO/RH(D): B POS
ANTIBODY SCREEN: NEGATIVE

## 2018-07-24 LAB — RPR: RPR: NONREACTIVE

## 2018-07-24 MED ORDER — FENTANYL 2.5 MCG/ML BUPIVACAINE 1/10 % EPIDURAL INFUSION (WH - ANES)
14.0000 mL/h | INTRAMUSCULAR | Status: DC | PRN
  Administered 2018-07-24: 13 mL/h via EPIDURAL
  Administered 2018-07-25: 14 mL/h via EPIDURAL
  Filled 2018-07-24 (×2): qty 100

## 2018-07-24 MED ORDER — PHENYLEPHRINE 40 MCG/ML (10ML) SYRINGE FOR IV PUSH (FOR BLOOD PRESSURE SUPPORT): 80.0000 ug | PREFILLED_SYRINGE | INTRAVENOUS | Status: DC | PRN

## 2018-07-24 MED ORDER — SOD CITRATE-CITRIC ACID 500-334 MG/5ML PO SOLN
30.0000 mL | ORAL | Status: DC | PRN
  Administered 2018-07-25: 30 mL via ORAL
  Filled 2018-07-24: qty 15

## 2018-07-24 MED ORDER — ACETAMINOPHEN 325 MG PO TABS: 650.0000 mg | ORAL_TABLET | ORAL | Status: DC | PRN

## 2018-07-24 MED ORDER — OXYTOCIN 40 UNITS IN LACTATED RINGERS INFUSION - SIMPLE MED: 2.5000 [IU]/h | INTRAVENOUS | Status: DC

## 2018-07-24 MED ORDER — OXYTOCIN 40 UNITS IN LACTATED RINGERS INFUSION - SIMPLE MED
1.0000 m[IU]/min | INTRAVENOUS | Status: DC
  Administered 2018-07-24: 2 m[IU]/min via INTRAVENOUS
  Filled 2018-07-24: qty 1000

## 2018-07-24 MED ORDER — DIPHENHYDRAMINE HCL 50 MG/ML IJ SOLN: 12.5000 mg | INTRAMUSCULAR | Status: DC | PRN

## 2018-07-24 MED ORDER — TERBUTALINE SULFATE 1 MG/ML IJ SOLN
0.2500 mg | Freq: Once | INTRAMUSCULAR | Status: AC | PRN
  Administered 2018-07-24: 0.25 mg via SUBCUTANEOUS
  Filled 2018-07-24: qty 1

## 2018-07-24 MED ORDER — LACTATED RINGERS IV SOLN
500.0000 mL | INTRAVENOUS | Status: DC | PRN
  Administered 2018-07-24 – 2018-07-25 (×2): 500 mL via INTRAVENOUS

## 2018-07-24 MED ORDER — MISOPROSTOL 25 MCG QUARTER TABLET
25.0000 ug | ORAL_TABLET | ORAL | Status: DC | PRN
  Administered 2018-07-24 (×4): 25 ug via VAGINAL
  Filled 2018-07-24 (×4): qty 1

## 2018-07-24 MED ORDER — LACTATED RINGERS IV SOLN: 500.0000 mL | Freq: Once | INTRAVENOUS | Status: DC

## 2018-07-24 MED ORDER — LIDOCAINE HCL (PF) 1 % IJ SOLN
INTRAMUSCULAR | Status: DC | PRN
  Administered 2018-07-24 (×2): 4 mL via EPIDURAL

## 2018-07-24 MED ORDER — PHENYLEPHRINE 40 MCG/ML (10ML) SYRINGE FOR IV PUSH (FOR BLOOD PRESSURE SUPPORT)
80.0000 ug | PREFILLED_SYRINGE | INTRAVENOUS | Status: DC | PRN
  Filled 2018-07-24: qty 10

## 2018-07-24 MED ORDER — ONDANSETRON HCL 4 MG/2ML IJ SOLN
4.0000 mg | Freq: Four times a day (QID) | INTRAMUSCULAR | Status: DC | PRN
  Administered 2018-07-24: 4 mg via INTRAVENOUS
  Filled 2018-07-24: qty 2

## 2018-07-24 MED ORDER — OXYTOCIN 40 UNITS IN LACTATED RINGERS INFUSION - SIMPLE MED
1.0000 m[IU]/min | INTRAVENOUS | Status: DC
  Administered 2018-07-24: 1 m[IU]/min via INTRAVENOUS

## 2018-07-24 MED ORDER — OXYTOCIN BOLUS FROM INFUSION: 500.0000 mL | Freq: Once | INTRAVENOUS | Status: DC

## 2018-07-24 MED ORDER — BUTORPHANOL TARTRATE 1 MG/ML IJ SOLN
1.0000 mg | INTRAMUSCULAR | Status: DC | PRN
  Administered 2018-07-24 (×2): 1 mg via INTRAVENOUS
  Filled 2018-07-24 (×2): qty 1

## 2018-07-24 MED ORDER — EPHEDRINE 5 MG/ML INJ: 10.0000 mg | INTRAVENOUS | Status: DC | PRN

## 2018-07-24 MED ORDER — LIDOCAINE HCL (PF) 1 % IJ SOLN: 30.0000 mL | INTRAMUSCULAR | Status: DC | PRN

## 2018-07-24 MED ORDER — LACTATED RINGERS IV SOLN
INTRAVENOUS | Status: DC
  Administered 2018-07-24 – 2018-07-25 (×6): via INTRAVENOUS

## 2018-07-24 NOTE — Anesthesia Preprocedure Evaluation (Addendum)
Anesthesia Evaluation  Patient identified by MRN, date of birth, ID band Patient awake    Reviewed: Allergy & Precautions, NPO status , Patient's Chart, lab work & pertinent test results  History of Anesthesia Complications Negative for: history of anesthetic complications  Airway Mallampati: II  TM Distance: >3 FB Neck ROM: Full    Dental no notable dental hx. (+) Teeth Intact, Dental Advisory Given   Pulmonary neg pulmonary ROS,    Pulmonary exam normal breath sounds clear to auscultation       Cardiovascular negative cardio ROS Normal cardiovascular exam Rhythm:Regular Rate:Normal     Neuro/Psych negative neurological ROS  negative psych ROS   GI/Hepatic negative GI ROS, Neg liver ROS,   Endo/Other  negative endocrine ROS  Renal/GU negative Renal ROS  negative genitourinary   Musculoskeletal negative musculoskeletal ROS (+)   Abdominal   Peds negative pediatric ROS (+)  Hematology negative hematology ROS (+)   Anesthesia Other Findings   Reproductive/Obstetrics (+) Pregnancy                            Anesthesia Physical Anesthesia Plan  ASA: II and emergent  Anesthesia Plan: Epidural   Post-op Pain Management:    Induction:   PONV Risk Score and Plan: 2 and Treatment may vary due to age or medical condition  Airway Management Planned: Natural Airway  Additional Equipment:   Intra-op Plan:   Post-operative Plan:   Informed Consent: I have reviewed the patients History and Physical, chart, labs and discussed the procedure including the risks, benefits and alternatives for the proposed anesthesia with the patient or authorized representative who has indicated his/her understanding and acceptance.   Dental advisory given  Plan Discussed with: CRNA and Surgeon  Anesthesia Plan Comments:        Anesthesia Quick Evaluation

## 2018-07-24 NOTE — Anesthesia Pain Management Evaluation Note (Signed)
  CRNA Pain Management Visit Note  Patient: Alexa Hudson, 27 y.o., female  "Hello I am a member of the anesthesia team at [facility]. We have an anesthesia team available at all times to provide care throughout the hospital, including epidural management and anesthesia for C-section. I don't know your plan for the delivery whether it a natural birth, water birth, IV sedation, nitrous supplementation, doula or epidural, but we want to meet your pain goals."   1.Was your pain managed to your expectations on prior hospitalizations?   No prior hospitalizations  2.What is your expectation for pain management during this hospitalization?     Labor support without medications  3.How can we help you reach that goal?   Record the patient's initial score and the patient's pain goal.   Pain: 3  Pain Goal: 6 [facility] wants you to be able to say your pain was always managed very well.  Kenniyah Sasaki Hristova 07/24/2018

## 2018-07-24 NOTE — Anesthesia Procedure Notes (Signed)
Epidural Patient location during procedure: OB Start time: 07/24/2018 6:53 PM End time: 07/24/2018 6:55 PM  Staffing Anesthesiologist: Kaylyn Layer, MD Performed: anesthesiologist   Preanesthetic Checklist Completed: patient identified, pre-op evaluation, timeout performed, IV checked, risks and benefits discussed and monitors and equipment checked  Epidural Patient position: sitting Prep: site prepped and draped and DuraPrep Patient monitoring: continuous pulse ox, blood pressure, heart rate and cardiac monitor Approach: midline Location: L3-L4 Injection technique: LOR air  Needle:  Needle type: Tuohy  Needle gauge: 17 G Needle length: 9 cm Needle insertion depth: 5 cm Catheter type: closed end flexible Catheter size: 19 Gauge Catheter at skin depth: 10 cm Test dose: negative and Other (1% lidocaine)  Assessment Sensory level: T8 Events: blood not aspirated, injection not painful, no injection resistance, negative IV test and no paresthesia  Additional Notes Patient identified. Risks, benefits, and alternatives discussed with patient including but not limited to bleeding, infection, nerve damage, paralysis, failed block, incomplete pain control, headache, blood pressure changes, nausea, vomiting, reactions to medication, itching, and postpartum back pain. Confirmed with bedside nurse the patient's most recent platelet count. Confirmed with patient that they are not currently taking any anticoagulation, have any bleeding history, or any family history of bleeding disorders. Patient expressed understanding and wished to proceed. All questions were answered. Sterile technique was used throughout the entire procedure. Please see nursing notes for vital signs. Crisp LOR on first pass. Test dose was given through epidural catheter and negative prior to continuing to dose epidural or start infusion. Warning signs of high block given to the patient including shortness of breath,  tingling/numbness in hands, complete motor block, or any concerning symptoms with instructions to call for help. Patient was given instructions on fall risk and not to get out of bed. All questions and concerns addressed with instructions to call with any issues or inadequate analgesia.  Reason for block:procedure for pain

## 2018-07-24 NOTE — Progress Notes (Signed)
Pt comfortable, no complaints. S/p 3 doses of cytotec, last dose placed recently. FHT Cat 1 TOCO: irreg SVE: per RN 2/50 Cont. Current care.

## 2018-07-24 NOTE — H&P (Signed)
27 y.o. [redacted]w[redacted]d  G1P0000 comes in c/o for schedule IOL for post dates.  Otherwise has good fetal movement and no bleeding.  History reviewed. No pertinent past medical history.  Past Surgical History:  Procedure Laterality Date  . WISDOM TOOTH EXTRACTION  01/2013  . WISDOM TOOTH EXTRACTION  2014    OB History  Gravida Para Term Preterm AB Living  1 0 0 0 0 0  SAB TAB Ectopic Multiple Live Births  0 0 0 0 0    # Outcome Date GA Lbr Len/2nd Weight Sex Delivery Anes PTL Lv  1 Current             Social History   Socioeconomic History  . Marital status: Married    Spouse name: Not on file  . Number of children: 0  . Years of education: Not on file  . Highest education level: Not on file  Occupational History  . Not on file  Social Needs  . Financial resource strain: Not hard at all  . Food insecurity:    Worry: Never true    Inability: Never true  . Transportation needs:    Medical: No    Non-medical: No  Tobacco Use  . Smoking status: Never Smoker  . Smokeless tobacco: Never Used  Substance and Sexual Activity  . Alcohol use: No  . Drug use: No  . Sexual activity: Yes    Partners: Male    Birth control/protection: Condom  Lifestyle  . Physical activity:    Days per week: 4 days    Minutes per session: 30 min  . Stress: Only a little  Relationships  . Social connections:    Talks on phone: Three times a week    Gets together: Once a week    Attends religious service: 1 to 4 times per year    Active member of club or organization: No    Attends meetings of clubs or organizations: Never    Relationship status: Married  . Intimate partner violence:    Fear of current or ex partner: No    Emotionally abused: No    Physically abused: No    Forced sexual activity: No  Other Topics Concern  . Not on file  Social History Narrative  . Not on file   Patient has no known allergies.    Prenatal Transfer Tool  Maternal Diabetes: No Genetic Screening:  Normal Maternal Ultrasounds/Referrals: Normal Fetal Ultrasounds or other Referrals:  None Maternal Substance Abuse:  No Significant Maternal Medications:  None Significant Maternal Lab Results: Lab values include: Group B Strep negative  Other PNC: uncomplicated.    Vitals:   07/24/18 0601 07/24/18 0701 07/24/18 0800 07/24/18 0908  BP: 113/75 122/79 130/88 122/83  Pulse: 75 79 78 88  Resp:   16 18  Temp:   98.1 F (36.7 C)   TempSrc:   Oral   Weight:      Height:        Lungs/Cor:  NAD Abdomen:  soft, gravid Ex:  no cords, erythema SVE:  2/20/-3  Per RN FHTs:  135, good STV, NST R; Cat 1 tracing. Toco:  q 2-5   A/P   Admitted last night for IOL for post dates  GBS Neg  cytotec for cervical ripening, then anticipated pitocin 2x2  Epidural upon request  Other routine care  [place], Luther Parody

## 2018-07-24 NOTE — Progress Notes (Addendum)
Alerted by RN that decel occurred and returned to 120 baseline with good variability after discontinuing pitocin and administering terb.  On review of chart I suspect transient low pressure across placenta this occurred within about an hour of epidural placement and as pt's BP dropped somewhat.  FHTs have been reassuring since with normal baseline, moderate variability, currently Cat 1.  Prior occ. variable decels.  Pt is comfortable, advised we can restart pitocin, will start 1x1 up to 4 and if baby tolerated advised RN can increase 2x2.  I examined pt and found her to be 4-5cm/90/ and -3 with head not well enough applied for rupture at this time.  Asked RN to place peanut to assist with head descent and rotation.

## 2018-07-25 ENCOUNTER — Encounter (HOSPITAL_COMMUNITY): Payer: Self-pay | Admitting: *Deleted

## 2018-07-25 ENCOUNTER — Encounter (HOSPITAL_COMMUNITY)
Admission: AD | Disposition: A | Discharge status: Home / Self Care | Payer: Self-pay | Source: Home / Self Care | Attending: Obstetrics and Gynecology

## 2018-07-25 SURGERY — Surgical Case
Anesthesia: Epidural

## 2018-07-25 MED ORDER — NALBUPHINE HCL 10 MG/ML IJ SOLN: 5.0000 mg | Freq: Once | INTRAMUSCULAR | Status: DC | PRN

## 2018-07-25 MED ORDER — FENTANYL CITRATE (PF) 100 MCG/2ML IJ SOLN: 25.0000 ug | INTRAMUSCULAR | Status: DC | PRN

## 2018-07-25 MED ORDER — NALBUPHINE HCL 10 MG/ML IJ SOLN: 5.0000 mg | INTRAMUSCULAR | Status: DC | PRN

## 2018-07-25 MED ORDER — KETOROLAC TROMETHAMINE 30 MG/ML IJ SOLN: 30.0000 mg | Freq: Once | INTRAMUSCULAR | Status: DC | PRN

## 2018-07-25 MED ORDER — OXYTOCIN 10 UNIT/ML IJ SOLN
INTRAMUSCULAR | Status: AC
  Filled 2018-07-25: qty 4

## 2018-07-25 MED ORDER — TETANUS-DIPHTH-ACELL PERTUSSIS 5-2.5-18.5 LF-MCG/0.5 IM SUSP: 0.5000 mL | Freq: Once | INTRAMUSCULAR | Status: DC

## 2018-07-25 MED ORDER — ZOLPIDEM TARTRATE 5 MG PO TABS: 5.0000 mg | ORAL_TABLET | Freq: Every evening | ORAL | Status: DC | PRN

## 2018-07-25 MED ORDER — SENNOSIDES-DOCUSATE SODIUM 8.6-50 MG PO TABS
2.0000 | ORAL_TABLET | ORAL | Status: DC
  Administered 2018-07-25 – 2018-07-27 (×2): 2 via ORAL
  Filled 2018-07-25 (×2): qty 2

## 2018-07-25 MED ORDER — COCONUT OIL OIL
1.0000 "application " | TOPICAL_OIL | Status: DC | PRN
  Filled 2018-07-25: qty 120

## 2018-07-25 MED ORDER — DEXAMETHASONE SODIUM PHOSPHATE 4 MG/ML IJ SOLN
INTRAMUSCULAR | Status: DC | PRN
  Administered 2018-07-25: 4 mg via INTRAVENOUS

## 2018-07-25 MED ORDER — KETOROLAC TROMETHAMINE 30 MG/ML IJ SOLN
30.0000 mg | Freq: Four times a day (QID) | INTRAMUSCULAR | Status: AC | PRN
  Administered 2018-07-25: 30 mg via INTRAMUSCULAR

## 2018-07-25 MED ORDER — OXYTOCIN 10 UNIT/ML IJ SOLN
INTRAVENOUS | Status: DC | PRN
  Administered 2018-07-25: 40 [IU] via INTRAVENOUS

## 2018-07-25 MED ORDER — DIPHENHYDRAMINE HCL 25 MG PO CAPS
25.0000 mg | ORAL_CAPSULE | Freq: Four times a day (QID) | ORAL | Status: DC | PRN
  Administered 2018-07-25 (×2): 25 mg via ORAL
  Filled 2018-07-25: qty 1

## 2018-07-25 MED ORDER — NALOXONE HCL 0.4 MG/ML IJ SOLN: 0.4000 mg | INTRAMUSCULAR | Status: DC | PRN

## 2018-07-25 MED ORDER — SCOPOLAMINE 1 MG/3DAYS TD PT72
MEDICATED_PATCH | TRANSDERMAL | Status: DC | PRN
  Administered 2018-07-25: 1 via TRANSDERMAL

## 2018-07-25 MED ORDER — WITCH HAZEL-GLYCERIN EX PADS: 1.0000 "application " | MEDICATED_PAD | CUTANEOUS | Status: DC | PRN

## 2018-07-25 MED ORDER — KETOROLAC TROMETHAMINE 30 MG/ML IJ SOLN
INTRAMUSCULAR | Status: AC
  Filled 2018-07-25: qty 1

## 2018-07-25 MED ORDER — SIMETHICONE 80 MG PO CHEW
80.0000 mg | CHEWABLE_TABLET | ORAL | Status: DC
  Administered 2018-07-25 – 2018-07-27 (×2): 80 mg via ORAL
  Filled 2018-07-25 (×2): qty 1

## 2018-07-25 MED ORDER — MORPHINE SULFATE (PF) 0.5 MG/ML IJ SOLN
INTRAMUSCULAR | Status: AC
  Filled 2018-07-25: qty 10

## 2018-07-25 MED ORDER — LACTATED RINGERS IV SOLN
INTRAVENOUS | Status: DC
  Administered 2018-07-25: 125 mL/h via INTRAVENOUS
  Administered 2018-07-25: via INTRAVENOUS

## 2018-07-25 MED ORDER — CEFAZOLIN SODIUM-DEXTROSE 2-3 GM-%(50ML) IV SOLR
INTRAVENOUS | Status: DC | PRN
  Administered 2018-07-25: 2 g via INTRAVENOUS

## 2018-07-25 MED ORDER — MORPHINE SULFATE (PF) 0.5 MG/ML IJ SOLN
INTRAMUSCULAR | Status: DC | PRN
  Administered 2018-07-25: 3 mg via EPIDURAL

## 2018-07-25 MED ORDER — ONDANSETRON HCL 4 MG/2ML IJ SOLN
INTRAMUSCULAR | Status: DC | PRN
  Administered 2018-07-25: 4 mg via INTRAVENOUS

## 2018-07-25 MED ORDER — FENTANYL CITRATE (PF) 250 MCG/5ML IJ SOLN
INTRAMUSCULAR | Status: AC
  Filled 2018-07-25: qty 5

## 2018-07-25 MED ORDER — SODIUM BICARBONATE 8.4 % IV SOLN
INTRAVENOUS | Status: AC
  Filled 2018-07-25: qty 50

## 2018-07-25 MED ORDER — SODIUM CHLORIDE 0.9% FLUSH: 3.0000 mL | INTRAVENOUS | Status: DC | PRN

## 2018-07-25 MED ORDER — DIPHENHYDRAMINE HCL 25 MG PO CAPS
25.0000 mg | ORAL_CAPSULE | ORAL | Status: DC | PRN
  Filled 2018-07-25 (×2): qty 1

## 2018-07-25 MED ORDER — ACETAMINOPHEN 325 MG PO TABS
650.0000 mg | ORAL_TABLET | ORAL | Status: DC | PRN
  Administered 2018-07-27 (×3): 650 mg via ORAL
  Filled 2018-07-25 (×3): qty 2

## 2018-07-25 MED ORDER — DIBUCAINE 1 % RE OINT: 1.0000 "application " | TOPICAL_OINTMENT | RECTAL | Status: DC | PRN

## 2018-07-25 MED ORDER — FENTANYL CITRATE (PF) 100 MCG/2ML IJ SOLN
INTRAMUSCULAR | Status: DC | PRN
  Administered 2018-07-25: 100 ug via INTRAVENOUS
  Administered 2018-07-25: 100 ug via EPIDURAL

## 2018-07-25 MED ORDER — PROMETHAZINE HCL 25 MG/ML IJ SOLN: 6.2500 mg | INTRAMUSCULAR | Status: DC | PRN

## 2018-07-25 MED ORDER — ONDANSETRON HCL 4 MG/2ML IJ SOLN: 4.0000 mg | Freq: Three times a day (TID) | INTRAMUSCULAR | Status: DC | PRN

## 2018-07-25 MED ORDER — SIMETHICONE 80 MG PO CHEW: 80.0000 mg | CHEWABLE_TABLET | ORAL | Status: DC | PRN

## 2018-07-25 MED ORDER — CEFAZOLIN SODIUM-DEXTROSE 2-4 GM/100ML-% IV SOLN
INTRAVENOUS | Status: AC
  Filled 2018-07-25: qty 100

## 2018-07-25 MED ORDER — OXYCODONE-ACETAMINOPHEN 5-325 MG PO TABS: 1.0000 | ORAL_TABLET | ORAL | Status: DC | PRN

## 2018-07-25 MED ORDER — NALOXONE HCL 4 MG/10ML IJ SOLN
1.0000 ug/kg/h | INTRAVENOUS | Status: DC | PRN
  Filled 2018-07-25: qty 5

## 2018-07-25 MED ORDER — PHENYLEPHRINE HCL 10 MG/ML IJ SOLN
INTRAMUSCULAR | Status: DC | PRN
  Administered 2018-07-25 (×2): 80 ug via INTRAVENOUS

## 2018-07-25 MED ORDER — ACETAMINOPHEN 500 MG PO TABS
1000.0000 mg | ORAL_TABLET | Freq: Four times a day (QID) | ORAL | Status: AC
  Administered 2018-07-25 (×3): 1000 mg via ORAL
  Filled 2018-07-25 (×3): qty 2

## 2018-07-25 MED ORDER — OXYCODONE-ACETAMINOPHEN 5-325 MG PO TABS: 2.0000 | ORAL_TABLET | ORAL | Status: DC | PRN

## 2018-07-25 MED ORDER — SIMETHICONE 80 MG PO CHEW
80.0000 mg | CHEWABLE_TABLET | Freq: Three times a day (TID) | ORAL | Status: DC
  Administered 2018-07-25 – 2018-07-27 (×5): 80 mg via ORAL
  Filled 2018-07-25 (×5): qty 1

## 2018-07-25 MED ORDER — MENTHOL 3 MG MT LOZG: 1.0000 | LOZENGE | OROMUCOSAL | Status: DC | PRN

## 2018-07-25 MED ORDER — LIDOCAINE-EPINEPHRINE (PF) 2 %-1:200000 IJ SOLN
INTRAMUSCULAR | Status: DC | PRN
  Administered 2018-07-25 (×3): 5 mL via EPIDURAL

## 2018-07-25 MED ORDER — DIPHENHYDRAMINE HCL 50 MG/ML IJ SOLN: 12.5000 mg | INTRAMUSCULAR | Status: DC | PRN

## 2018-07-25 MED ORDER — FENTANYL CITRATE (PF) 100 MCG/2ML IJ SOLN
INTRAMUSCULAR | Status: AC
  Filled 2018-07-25: qty 2

## 2018-07-25 MED ORDER — ONDANSETRON HCL 4 MG/2ML IJ SOLN
INTRAMUSCULAR | Status: AC
  Filled 2018-07-25: qty 4

## 2018-07-25 MED ORDER — KETOROLAC TROMETHAMINE 30 MG/ML IJ SOLN: 30.0000 mg | Freq: Four times a day (QID) | INTRAMUSCULAR | Status: AC | PRN

## 2018-07-25 MED ORDER — OXYTOCIN 40 UNITS IN LACTATED RINGERS INFUSION - SIMPLE MED: 2.5000 [IU]/h | INTRAVENOUS | Status: AC

## 2018-07-25 MED ORDER — IBUPROFEN 600 MG PO TABS
600.0000 mg | ORAL_TABLET | Freq: Four times a day (QID) | ORAL | Status: DC
  Administered 2018-07-25 – 2018-07-27 (×9): 600 mg via ORAL
  Filled 2018-07-25 (×9): qty 1

## 2018-07-25 MED ORDER — SCOPOLAMINE 1 MG/3DAYS TD PT72
MEDICATED_PATCH | TRANSDERMAL | Status: AC
  Filled 2018-07-25: qty 1

## 2018-07-25 MED ORDER — LIDOCAINE-EPINEPHRINE (PF) 2 %-1:200000 IJ SOLN
INTRAMUSCULAR | Status: AC
  Filled 2018-07-25: qty 20

## 2018-07-25 MED ORDER — LACTATED RINGERS IV SOLN
INTRAVENOUS | Status: DC | PRN
  Administered 2018-07-25: 06:00:00 via INTRAVENOUS

## 2018-07-25 MED ORDER — PRENATAL MULTIVITAMIN CH
1.0000 | ORAL_TABLET | Freq: Every day | ORAL | Status: DC
  Administered 2018-07-27: 1 via ORAL
  Filled 2018-07-25: qty 1

## 2018-07-25 MED ORDER — DEXAMETHASONE SODIUM PHOSPHATE 4 MG/ML IJ SOLN
INTRAMUSCULAR | Status: AC
  Filled 2018-07-25: qty 1

## 2018-07-25 SURGICAL SUPPLY — 38 items
APL SKNCLS STERI-STRIP NONHPOA (GAUZE/BANDAGES/DRESSINGS) ×1
BENZOIN TINCTURE PRP APPL 2/3 (GAUZE/BANDAGES/DRESSINGS) ×3 IMPLANT
CHLORAPREP W/TINT 26ML (MISCELLANEOUS) ×3 IMPLANT
CLAMP CORD UMBIL (MISCELLANEOUS) IMPLANT
CLOSURE WOUND 1/2 X4 (GAUZE/BANDAGES/DRESSINGS) ×1
CLOTH BEACON ORANGE TIMEOUT ST (SAFETY) ×3 IMPLANT
DRSG OPSITE POSTOP 4X10 (GAUZE/BANDAGES/DRESSINGS) ×3 IMPLANT
ELECT REM PT RETURN 9FT ADLT (ELECTROSURGICAL) ×3
ELECTRODE REM PT RTRN 9FT ADLT (ELECTROSURGICAL) ×1 IMPLANT
EXTRACTOR VACUUM M CUP 4 TUBE (SUCTIONS) IMPLANT
EXTRACTOR VACUUM M CUP 4' TUBE (SUCTIONS)
GAUZE SPONGE 4X4 12PLY STRL LF (GAUZE/BANDAGES/DRESSINGS) ×6 IMPLANT
GLOVE BIOGEL PI IND STRL 6.5 (GLOVE) ×1 IMPLANT
GLOVE BIOGEL PI IND STRL 7.0 (GLOVE) ×1 IMPLANT
GLOVE BIOGEL PI INDICATOR 6.5 (GLOVE) ×2
GLOVE BIOGEL PI INDICATOR 7.0 (GLOVE) ×2
GLOVE ECLIPSE 6.5 STRL STRAW (GLOVE) ×3 IMPLANT
GOWN STRL REUS W/TWL LRG LVL3 (GOWN DISPOSABLE) ×6 IMPLANT
KIT ABG SYR 3ML LUER SLIP (SYRINGE) IMPLANT
NEEDLE HYPO 25X5/8 SAFETYGLIDE (NEEDLE) IMPLANT
NS IRRIG 1000ML POUR BTL (IV SOLUTION) ×3 IMPLANT
PACK C SECTION WH (CUSTOM PROCEDURE TRAY) ×3 IMPLANT
PAD ABD 7.5X8 STRL (GAUZE/BANDAGES/DRESSINGS) ×3 IMPLANT
PAD OB MATERNITY 4.3X12.25 (PERSONAL CARE ITEMS) ×3 IMPLANT
PENCIL SMOKE EVAC W/HOLSTER (ELECTROSURGICAL) ×3 IMPLANT
RTRCTR C-SECT PINK 25CM LRG (MISCELLANEOUS) ×3 IMPLANT
SPONGE LAP 18X18 RF (DISPOSABLE) ×9 IMPLANT
STRIP CLOSURE SKIN 1/2X4 (GAUZE/BANDAGES/DRESSINGS) ×2 IMPLANT
SUT MON AB 2-0 CT1 27 (SUTURE) ×3 IMPLANT
SUT MON AB 4-0 PS1 27 (SUTURE) IMPLANT
SUT PDS AB 0 CTX 60 (SUTURE) IMPLANT
SUT PLAIN 2 0 XLH (SUTURE) IMPLANT
SUT VIC AB 0 CTX 36 (SUTURE) ×12
SUT VIC AB 0 CTX36XBRD ANBCTRL (SUTURE) ×4 IMPLANT
SUT VIC AB 4-0 KS 27 (SUTURE) IMPLANT
TOWEL OR 17X24 6PK STRL BLUE (TOWEL DISPOSABLE) ×3 IMPLANT
TRAY FOLEY W/BAG SLVR 14FR LF (SET/KITS/TRAYS/PACK) ×3 IMPLANT
WATER STERILE IRR 1000ML POUR (IV SOLUTION) ×3 IMPLANT

## 2018-07-25 NOTE — Brief Op Note (Signed)
07/24/2018 - 07/25/2018  6:59 AM  PATIENT:  Julious Payer  27 y.o. female  PRE-OPERATIVE DIAGNOSIS:  Primary Cesarean Section; non reassuring fetal status; arrest of dilation  POST-OPERATIVE DIAGNOSIS:  Primary Cesarean Section; non reassuring fetal status; arrest of dilation; thick meconium, nuchal cord  PROCEDURE:  Procedure(s): CESAREAN SECTION (N/A) - low transverse SURGEON:  Surgeon(s) and Role:    Claiborne Billings, Ash Mcelwain, DO - Primary  ANESTHESIA:   epidural   FINDINGS: female infant, cephalic presentation, thick meconium, nuchal cord/body cord, normal tubes and ovaries bilaterally  EBL:  743 mL   SPECIMEN:  Source of Specimen:  placenta  DISPOSITION OF SPECIMEN:  PATHOLOGY  COUNTS:  YES  PLAN OF CARE: Admit to inpatient   PATIENT DISPOSITION:  PACU - hemodynamically stable.   Delay start of Pharmacological VTE agent (>24hrs) due to surgical blood loss or risk of bleeding: not applicable

## 2018-07-25 NOTE — Lactation Note (Signed)
This note was copied from a baby's chart. Lactation Consultation Note  Patient Name: Alexa Hudson ZOXWR'U Date: 07/25/2018 Reason for consult: Follow-up assessment P1. 17 hour female infant, Normal glucose 52 was done due jitteriness, Per mom, BF for 30 minutes prior LC entering room, nurse was concern due infant dimpling at breast. LC notice dimpling when mom used cross cradle hold but not when infant was reposition to football hold on left breast. Reinforced with mom infant open mouth wide with latching and extending lower jaw down infant feed for additional 8 minutes.  LC noticed deep latch with audible swallowing observed. Mom will continue to breastfeed according hunger cues, 8 to 12 times within 24 hours.  Maternal Data    Feeding Feeding Type: Breast Fed  LATCH Score Latch: Grasps breast easily, tongue down, lips flanged, rhythmical sucking.  Audible Swallowing: Spontaneous and intermittent  Type of Nipple: Inverted  Comfort (Breast/Nipple): Soft / non-tender  Hold (Positioning): Assistance needed to correctly position infant at breast and maintain latch.  LATCH Score: 7  Interventions Interventions: Hand express;Assisted with latch;Adjust position;Support pillows  Lactation Tools Discussed/Used     Consult Status Consult Status: Follow-up Date: 07/26/18 Follow-up type: In-patient    Danelle Earthly 07/25/2018, 11:20 PM

## 2018-07-25 NOTE — Transfer of Care (Signed)
Immediate Anesthesia Transfer of Care Note  Patient: Alexa Hudson  Procedure(s) Performed: CESAREAN SECTION (N/A )  Patient Location: PACU  Anesthesia Type:Epidural  Level of Consciousness: awake, alert  Airway & Oxygen Therapy: Patient Spontanous Breathing  Post-op Assessment: Post -op Vital signs reviewed and stable  Post vital signs: Reviewed and stable  Last Vitals:  Vitals Value Taken Time  BP 123/77 07/25/2018  7:13 AM  Temp    Pulse 118 07/25/2018  7:14 AM  Resp 15 07/25/2018  7:14 AM  SpO2 97 % 07/25/2018  7:14 AM  Vitals shown include unvalidated device data.  Last Pain:  Vitals:   07/25/18 0539  TempSrc:   PainSc: 0-No pain      Patients Stated Pain Goal: 5 (07/24/18 2230)  Complications: No apparent anesthesia complications

## 2018-07-25 NOTE — Progress Notes (Signed)
Alerted by nurse of additional prolonged decel, with return to 150 baseline, minimal variability.  I rechecked patient and attempted scalp stim without result and 3 min decel occurred, now returned to 140 baseline with moderate variability and no further decels.  Unable to restart pitocin d/t fetal tracing, pt has been persistently at 6cm for over 5 hours, minimal head descent and still at -2 station.  Discussed with patient, and she declines any further trial of labor and wishes to proceed to c/s.  Reviewed risks, benefits, alternatives, complications and consent obtained.

## 2018-07-25 NOTE — Anesthesia Postprocedure Evaluation (Signed)
Anesthesia Post Note  Patient: Alexa Hudson  Procedure(s) Performed: CESAREAN SECTION (N/A )     Patient location during evaluation: Mother Baby Anesthesia Type: Epidural Level of consciousness: awake and alert Pain management: pain level controlled Vital Signs Assessment: post-procedure vital signs reviewed and stable Respiratory status: spontaneous breathing, nonlabored ventilation and respiratory function stable Cardiovascular status: stable Postop Assessment: no headache, no backache and epidural receding Anesthetic complications: no    Last Vitals:  Vitals:   07/25/18 1320 07/25/18 1330  BP: 114/83 110/67  Pulse: 84 79  Resp: 20 20  Temp:    SpO2: 98% 97%    Last Pain:  Vitals:   07/25/18 1310  TempSrc: Oral  PainSc:    Pain Goal: Patients Stated Pain Goal: 5 (07/24/18 2230)               Danetta Prom

## 2018-07-25 NOTE — Consult Note (Signed)
Neonatology Note:   Attendance at C-section:    I was asked by Dr. Claiborne Billings to attend this primary C/S at term for FTP and prolonged decels. The mother is a G1, GBS neg with good prenatal care. IOL for dates.  ROM 4 hours before delivery, fluid copious meconium.  Herron x1 reduced.  Infant not vigorous nor with good spontaneous cry and tone. Cord immediately clamped and infant brought to warmer and dried and stimulated.  Infant with "stunned" look.  HR trending down <100.  PPV initiated due to lack of resp effort.  Great response with gasping and subsequent good effort.  Sao2 placed and in 80s.  HR >100.  CPAP continued for short period then blow by for a couple minutes. Sao2 and comfortable wob on RA.  Lungs clear, pink and active.  Ap 3/9.  To CN to care of Pediatrician.  Dineen Kid Leary Roca, MD

## 2018-07-25 NOTE — Lactation Note (Signed)
This note was copied from a baby's chart. Lactation Consultation Note  Patient Name: Boy Kaaliyah Kita ZOXWR'U Date: 07/25/2018 Reason for consult: Initial assessment;Term;[facility] Breastfeeding consultation services and support information given and reviewed.  Baby is 4 hours old and has been to breast once in PACU.  Baby is currently showing feeding cues.  Instructed on hand expression and a small drop obtained.  Attempted football hold but baby unable to grasp breast.  Mom has small breasts and compression needed for baby to grasp tissue.  Nipples everted.  After several attempts baby latched in a semi laid back position. Baby fed for 15 minutes and then latched to opposite breast.  Basic teaching done and questions answered.  Instructed to feed with cues, skin to skin as much as possible, hand expression between feeds and call for assist prn.  Maternal Data Has patient been taught Hand Expression?: Yes Does the patient have breastfeeding experience prior to this delivery?: No  Feeding Feeding Type: Breast Fed  LATCH Score Latch: Repeated attempts needed to sustain latch, nipple held in mouth throughout feeding, stimulation needed to elicit sucking reflex.  Audible Swallowing: A few with stimulation  Type of Nipple: Everted at rest and after stimulation  Comfort (Breast/Nipple): Soft / non-tender  Hold (Positioning): Assistance needed to correctly position infant at breast and maintain latch.  LATCH Score: 7  Interventions Interventions: Assisted with latch;Breast compression;Skin to skin;Adjust position;Breast massage;Support pillows;Hand express;Position options  Lactation Tools Discussed/Used     Consult Status Consult Status: Follow-up Date: 07/26/18 Follow-up type: In-patient    Huston Foley 07/25/2018, 11:19 AM

## 2018-07-26 LAB — CBC
HEMATOCRIT: 28.5 % — AB (ref 36.0–46.0)
Hemoglobin: 9.7 g/dL — ABNORMAL LOW (ref 12.0–15.0)
MCH: 30.6 pg (ref 26.0–34.0)
MCHC: 34 g/dL (ref 30.0–36.0)
MCV: 89.9 fL (ref 80.0–100.0)
NRBC: 0 % (ref 0.0–0.2)
PLATELETS: 166 10*3/uL (ref 150–400)
RBC: 3.17 MIL/uL — AB (ref 3.87–5.11)
RDW: 14.1 % (ref 11.5–15.5)
WBC: 12.5 10*3/uL — ABNORMAL HIGH (ref 4.0–10.5)

## 2018-07-26 NOTE — Progress Notes (Signed)
Patient is doing well.  She is tolerating PO, ambulating, voiding.  Pain is controlled.  Lochia is appropriate  Vitals:   07/25/18 1949 07/25/18 2208 07/26/18 0014 07/26/18 0420  BP: 116/72  114/64 (!) 117/50  Pulse: 69  70 82  Resp: 18  18 20   Temp: 98.3 F (36.8 C)  98.5 F (36.9 C) 98.2 F (36.8 C)  TempSrc: Oral  Axillary Oral  SpO2: 95% 95% 98% 98%  Weight:      Height:        NAD Abdomen:  soft, appropriate tenderness, incisions intact and without erythema or drainage ext:    Symmetric, trace edema bilaterally  Lab Results  Component Value Date   WBC 12.5 (H) 07/26/2018   HGB 9.7 (L) 07/26/2018   HCT 28.5 (L) 07/26/2018   MCV 89.9 07/26/2018   PLT 166 07/26/2018    --/--/B POS Performed at [facility], [address] [place], [place] [address]  (10/30 0053)/RImmune  A/P    27 y.o. G1P1001 POD 1 s/p primary CD for NRFS Routine post op and postpartum care.

## 2018-07-26 NOTE — Op Note (Signed)
Alexa Hudson, Alexa Hudson MEDICAL RECORD [id number] ACCOUNT [id number] DATE OF BIRTH:05-31-1991 FACILITY: [facility] LOCATION: [id number] PHYSICIAN:Bhavika Schnider M. Claiborne Billings, DO  OPERATIVE REPORT  DATE OF PROCEDURE:  07/24/2018  PREOPERATIVE DIAGNOSIS:  Primary cesarean section, nonreassuring fetal status, arrest of dilation.  POSTOPERATIVE DIAGNOSIS:  Primary cesarean section, nonreassuring fetal status, arrest of dilation, thick meconium and nuchal cord.  PROCEDURE:  Low transverse cesarean section.  SURGEON:  Philip Aspen, DO  ANESTHESIA:  Epidural.  FINDINGS:  Female infant in cephalic presentation, thick meconium, nuchal cord/body cord, normal tubes and ovaries bilaterally.  ESTIMATED BLOOD LOSS:  743 mL  SPECIMENS:  Placenta to pathology.  COMPLICATIONS:  None.  CONDITION:  Stable to PACU.  DESCRIPTION OF PROCEDURE:  The patient was taken to the operating room where epidural anesthesia was administered and found to be adequate.  She was prepped and draped in the normal sterile fashion in dorsal supine position with a leftward tilt.  A  scalpel was used to make a Pfannenstiel skin incision and Bovie was used to carry this down to underlying layer of fascia.  The fascia was incised in the midline with the scalpel and extended laterally with Mayo scissors.  Kocher clamps were placed at  the superior aspect of the fascial incision and rectus muscles were dissected off bluntly and sharply.  Kocher clamps were then placed at the inferior aspect of the fascial incision and rectus muscles were also dissected off bluntly and sharply.   Hemostat was used to separate rectus muscles with good visualization of the peritoneum, it was entered bluntly.  The peritoneal opening was extended by lateral manual traction.  The abdomen and pelvis was gently manually surveyed and an Information systems manager was then placed.  The vesicouterine peritoneum was identified, tented, and entered sharply with Metzenbaum  scissors, extended laterally, and the bladder flap was developed digitally.  A scalpel was used to make a low transverse cesarean  incision and the amniotic sac was entered sharply with immediate extrusion of thick meconium noted.  This incision was extended by cephalic and caudal traction.  Infant's head was located, elevated without difficulty and delivered through the hysterotomy  followed by the remainder of the infant's body.  Infant appeared vigorous.  However, due to thick meconium, cord was clamped after probably less than 30 seconds and infant was handed off immediately to neonatology for assessment.  Cord blood was  collected.  The external uterus was massaged to facilitate increasing tone and with gentle traction on the umbilical cord, the placenta was delivered without difficulty.  Membranes were swept and all contents and debris were removed from the uterus.  The  uterine incision was reapproximated with 0 Vicryl in a running locked fashion followed by second layer of vertical imbrication and one additional figure-of-eight to the left of midline.  Excellent hemostasis was noted.  Ovaries and tubes were observed  and found to be normal.  Alexis -self retractor was removed.  All surfaces were examined and found to be hemostatic.  Peritoneum was reapproximated and closed with 2-0 Vicryl in a running fashion.  Fascia was reapproximated and closed with 0 Vicryl in a  running fashion.  Subcutaneous tissue was irrigated, dried and minimal use of Bovie cautery was needed for hemostasis.  Skin was then reapproximated and closed with Vicryl subcuticularly.  The patient tolerated the procedure well.  Sponge, lap and needle  counts were correct x2.  The patient was taken to recovery in stable condition.  TN/NUANCE  D:07/26/2018 T:07/26/2018 JOB:[id number]

## 2018-07-26 NOTE — Lactation Note (Addendum)
This note was copied from a baby's chart. Lactation Consultation Note  Patient Name: Alexa Hudson OZHYQ'M Date: 07/26/2018   Telephone call from RN reporting baby 17 hours old and has not had a wet diaper. Reports infant keeps coming off and on the breast. Assist in breastfeeding in football.  Mom reports that's what he does best in. Mom shaping her breast in the same way as babies mouth is so showed mom how to shape breast like a sandwich the other way to get more tissue in. Infant not dimpling now but he keeps resituating himself at the breast.  Comes off and on and off and on.  Used nipple shield to help him maintain at the breast. He does not appear to elevate tongue to midline when crying. Showed mom how to apply nipple shield and encouraged her to hand express prior to applying nipple shield.  Showed mom and dad how to give supplement at the breast with syringe and 5 french feeding tube while infant at the breast. Gave infant 17 ml of Similac infant formula at the breast this way.  Parents report this seems time consuming and hard to go home with tomorrow. Mom teary eyed.  Encouraged them to take one day at a time.  Explained dad could also feed supplement to infant anyway they were comfortable.  Dad could also fed past breastfeeding while mom is pumping.  Also showed parents how to use curved tip syringe.Urged parents to feed on cue and 8 or more times day.  Urged mom to pump past the breastfeedings that she could for 15 minutes( but no more than 8 breastfeedings)  So try to pump past the every 3 hour breastfeed during the day.  If infant cluster feeding at night/may not be able to breastpump at night.   Showed her the initiate setting and reviewed how to use breastpump.The second latch score is after applying the nipple shield. Parents request to be seen by lactation tomorrow am.   Maternal Data    Feeding    LATCH Score                   Interventions    Lactation Tools  Discussed/Used     Consult Status      Neomia Dear 07/26/2018, 9:56 PM

## 2018-07-27 MED ORDER — IBUPROFEN 600 MG PO TABS: 600.0000 mg | ORAL_TABLET | Freq: Four times a day (QID) | ORAL | 0 refills | Status: DC

## 2018-07-27 MED ORDER — OXYCODONE HCL 5 MG PO TABS: 2.5000 mg | ORAL_TABLET | Freq: Four times a day (QID) | ORAL | 0 refills | Status: DC | PRN

## 2018-07-27 NOTE — Progress Notes (Signed)
Patient is doing well.  She is tolerating PO, ambulating, voiding.  Pain is controlled.  Lochia is appropriate  Vitals:   07/26/18 0420 07/26/18 1816 07/27/18 0043 07/27/18 0603  BP: (!) 117/50 (!) 132/94 130/78 118/84  Pulse: 82 81 86 80  Resp: 20  18 16   Temp: 98.2 F (36.8 C) 98.1 F (36.7 C) 98.8 F (37.1 C) 98 F (36.7 C)  TempSrc: Oral Oral Oral Oral  SpO2: 98% 99% 98% 99%  Weight:      Height:        NAD Abdomen:  soft, appropriate tenderness, incisions intact and without erythema or drainage ext:    Symmetric, trace edema bilaterally  Lab Results  Component Value Date   WBC 12.5 (H) 07/26/2018   HGB 9.7 (L) 07/26/2018   HCT 28.5 (L) 07/26/2018   MCV 89.9 07/26/2018   PLT 166 07/26/2018    --/--/B POS Performed at [facility], [address], [place], [place] [address]  (10/30 0053)/RImmune  A/P    27 y.o. G1P1001 POD #2 s/p primary CD for NRFS Routine post op and postpartum care.   Meeting all goals--d/c to home today after circumcision  Desires circumcision. Discussed r/b/a of the procedure. Reviewed that circumcision is an elective surgical procedure and not considered medically necessary. Reviewed the risks of the procedure including the risk of infection, bleeding, damage to surrounding structures, including scrotum, shaft, urethra and head of penis, and an undesired cosmetic effect requiring additional procedures for revision. Consent signed.

## 2018-07-27 NOTE — Lactation Note (Signed)
This note was copied from a baby's chart. Lactation Consultation Note  Patient Name: Alexa Hudson Date: 07/27/2018 Reason for consult: Follow-up assessment;Other (Comment);Infant weight loss;Term(post dates 40 1/7 - see LC note / permom plans to pump and bottle feed )  Per Dr. 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  Baby is 66 hours old.  As LC entered the room, baby in dads arms asleep , and mom double pumping with Medela.  with no EBM yield at this pumping. LC reassured mom it can be a slow process and to continue to be consistent with pumping. ( Supply and demand discussed - 8-10 x's a day )   Per mom  baby recently fed at 1600 40 ml with artifical nipple ( green ) and took 25 mins.  Per mom, we have talked ( mom and husband ) and have decided to pump and bottle feed.  While I'm pumping,dad is going to feed the baby.. LC reviewed PACE feeding. And the importance of noting the time it takes to feed the baby with the bottle.  Per mom will have a DEBP - Medela at home.  Mom and dad expressed they both feel comfortable with the plan because the were so worried the baby wasn't getting enough and the feeding plan was taking to long.  Mom expressed she felt she doesn't have the patience to continue.  LC encouraged to call if needing help PACE feeding.    Maternal Data    Feeding Feeding Type: (baby recently fed at 1600 )  LATCH Score                   Interventions Interventions: Breast feeding basics reviewed  Lactation Tools Discussed/Used Tools: Pump Breast pump type: Double-Electric Breast Pump Pump Review: Setup, frequency, and cleaning Initiated by:: MAI reviewed  #1 amd #2 phase of DEBP (mom pumping when LC entered the room )   Consult Status Consult Status: Follow-up Date: 07/28/18 Follow-up type: In-patient    Alexa Hudson Alexa Hudson 07/27/2018, 4:57 PM

## 2018-07-27 NOTE — Discharge Summary (Signed)
Obstetric Discharge Summary Reason for Admission: induction of labor and post-term pregnancy Prenatal Procedures: none Intrapartum Procedures: cesarean: low cervical, transverse Postpartum Procedures: none Complications-Operative and Postpartum: none Hemoglobin  Date Value Ref Range Status  07/26/2018 9.7 (L) 12.0 - 15.0 g/dL Final   Hemoglobin, fingerstick  Date Value Ref Range Status  05/25/2014 13.9 12.0 - 16.0 g/dL Final   HCT  Date Value Ref Range Status  07/26/2018 28.5 (L) 36.0 - 46.0 % Final    Physical Exam:  General: alert, cooperative and appears stated age 27: appropriate Uterine Fundus: firm Incision: healing well, no significant drainage, no significant erythema DVT Evaluation: No evidence of DVT seen on physical exam.  Discharge Diagnoses: Term Pregnancy-delivered  Discharge Information: Date: 07/27/2018 Activity: pelvic rest Diet: routine Medications: PNV, Ibuprofen and oxycodone Condition: stable Instructions: refer to practice specific booklet Discharge to: home Follow-up Information    Philip Aspen, DO Follow up in 4 week(s).   Specialty:  Obstetrics and Gynecology Contact information: [address] [place] [address] [phone]           Newborn Data: Live born female  Birth Weight: 7 lb 5.8 oz (3340 g) APGAR: 3, 9  Newborn Delivery   Birth date/time:  07/25/2018 06:07:00 Delivery type:  C-Section, Low Transverse Trial of labor:  Yes C-section categorization:  Primary     Home with mother.  Kamera Dubas GEFFEL Valari Taylor 07/27/2018, 11:47 AM

## 2018-07-27 NOTE — Discharge Instructions (Signed)
You may wash incision with soap and water.   °Do not soak the incision for 2 weeks (no tub baths or swimming).   °Keep incision dry. You may need to keep a sanitary pad or panty liner between the incision and your clothing for comfort and to keep the incision dry.  If you note drainage, increased pain, or increased redness of the incision, then please notify your physician. ° °Pelvic rest x 6 weeks (no intercourse or tampons)  ° °No lifting over 10 lbs for 6 weeks.  ° °Do not drive until you are not taking narcotic pain medication AND you can comfortably slam on the brakes. ° °Please continue to take a prenatal vitamin daily.  You may experience constipation from the iron, so add a stool softener as needed. ° °

## 2018-07-28 ENCOUNTER — Ambulatory Visit: Payer: Self-pay

## 2018-07-28 NOTE — Lactation Note (Addendum)
This note was copied from a baby's chart. Lactation Consultation Note  Patient Name: Alexa Hudson ZOXWR'U Date: 07/28/2018 Reason for consult: Follow-up assessment;Infant weight loss;Term(1% weight loss / tight anterior frenulum / mom pumping and bottle feeding )  LC reviewed and updated the doc flow sheets. Extra diaper sheets given to mom.  Baby is 76 hours old and due to the anterior tight frenulum , yesterday mom decided to pump and bottle feed. Per mom has been pumping and every 3 hours while dad has been feeding baby up to 45 ml in 20 mins range of time . Dad and mom mentioned baby is tolerating it well and very little spitting.  Per mom the most she has been getting pumping is 4 ml and using the #2& flanges.  Yesterday mom was using one #27 and the other #24 F . LC recommended yesterday she  Needed to use the #24 F and if they were comfortable to use them for better stimulation.  LC reminded mom to try it her next pumping. Mom denies soreness. LC reviewed sore nipple and engorgement prevention and tx. Per mom has DEBP Medela at  Home and LC explained to mom how it works , Supply and demand, reassured her when solely pumping it takes time.  And consistent pumping around the clock is important.  Mother informed of post-discharge support and given phone number to the lactation department, including services for phone call assistance; out-patient appointments; and breastfeeding support group. List of other breastfeeding resources in the community given in the handout. Encouraged mother to call for problems or concerns related to breastfeeding.  LC helped mom change a stool diaper and when baby was crying LC identified the tight frenulum to the tip, and indention on the top of the tongue. Upper lip stretches well, and baby has wide gape. Sucks well on a gloved finger.    Maternal Data    Feeding Feeding Type: (last fed at 0900 per mom ) Nipple Type: Slow - flow  LATCH Score                   Interventions Interventions: Breast feeding basics reviewed;DEBP  Lactation Tools Discussed/Used Tools: Pump Breast pump type: Double-Electric Breast Pump Pump Review: Milk Storage;Setup, frequency, and cleaning Initiated by:: MAI / reviewed ( mom asked and had several questions / also for her pump at home  Date initiated:: 07/28/18   Consult Status Consult Status: Complete Date: 07/28/18    [name] Kurtiss Wence 07/28/2018, 10:52 AM

## 2019-02-14 IMAGING — RF DG HYSTEROGRAM
5 series · 5 of 5 positions shown · IV contrast (omnipaque)
Comparison: None.

CLINICAL DATA: Primary female infertility.

EXAM:
HYSTEROSALPINGOGRAM
TECHNIQUE: Following cleansing of the cervix and vagina with Betadine solution,
a hysterosalpingogram was performed using a 5-French
hysterosalpingogram catheter and Omnipaque 300 contrast. The patient
tolerated the examination without difficulty.

[Series 1: run · 1 of 1 slices shown (1 of 5)]
[im 1/1]
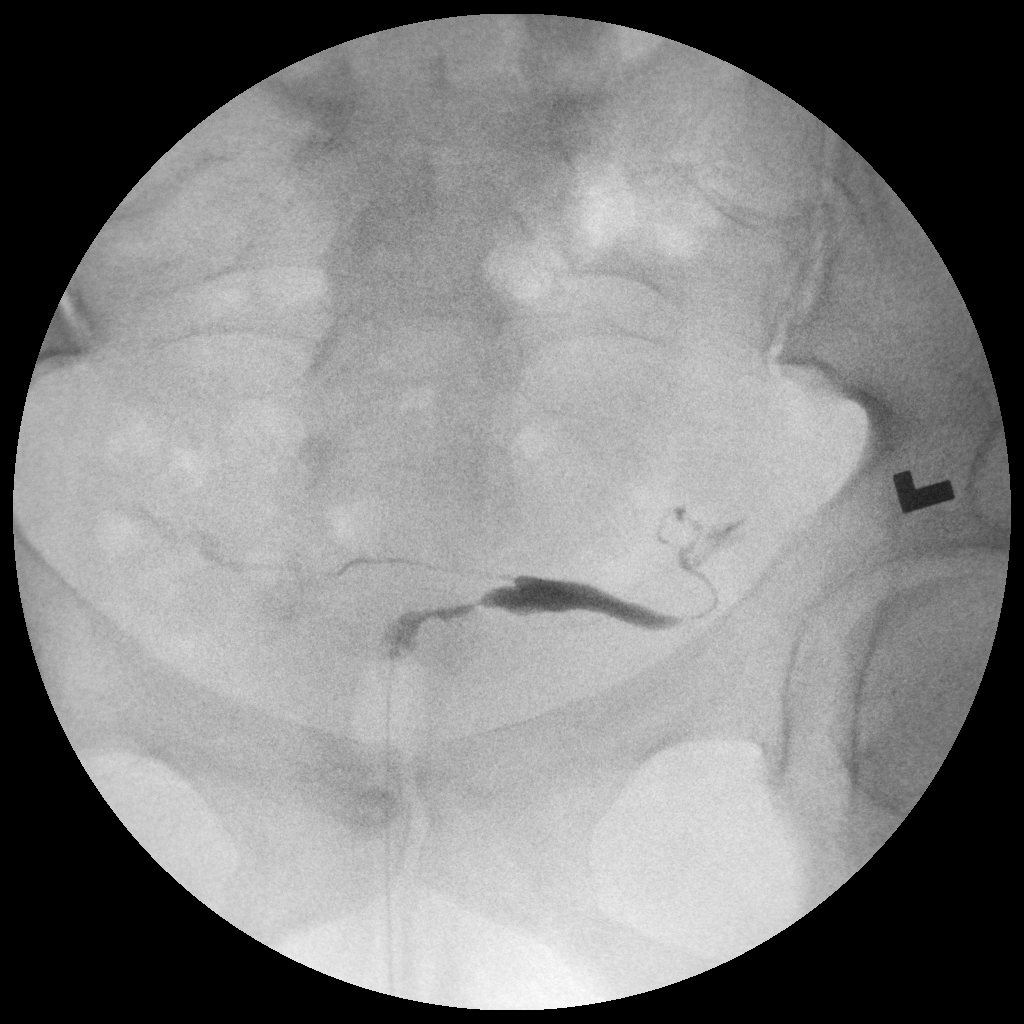

[Series 2: run · 1 of 1 slices shown (2 of 5)]
[im 1/1]
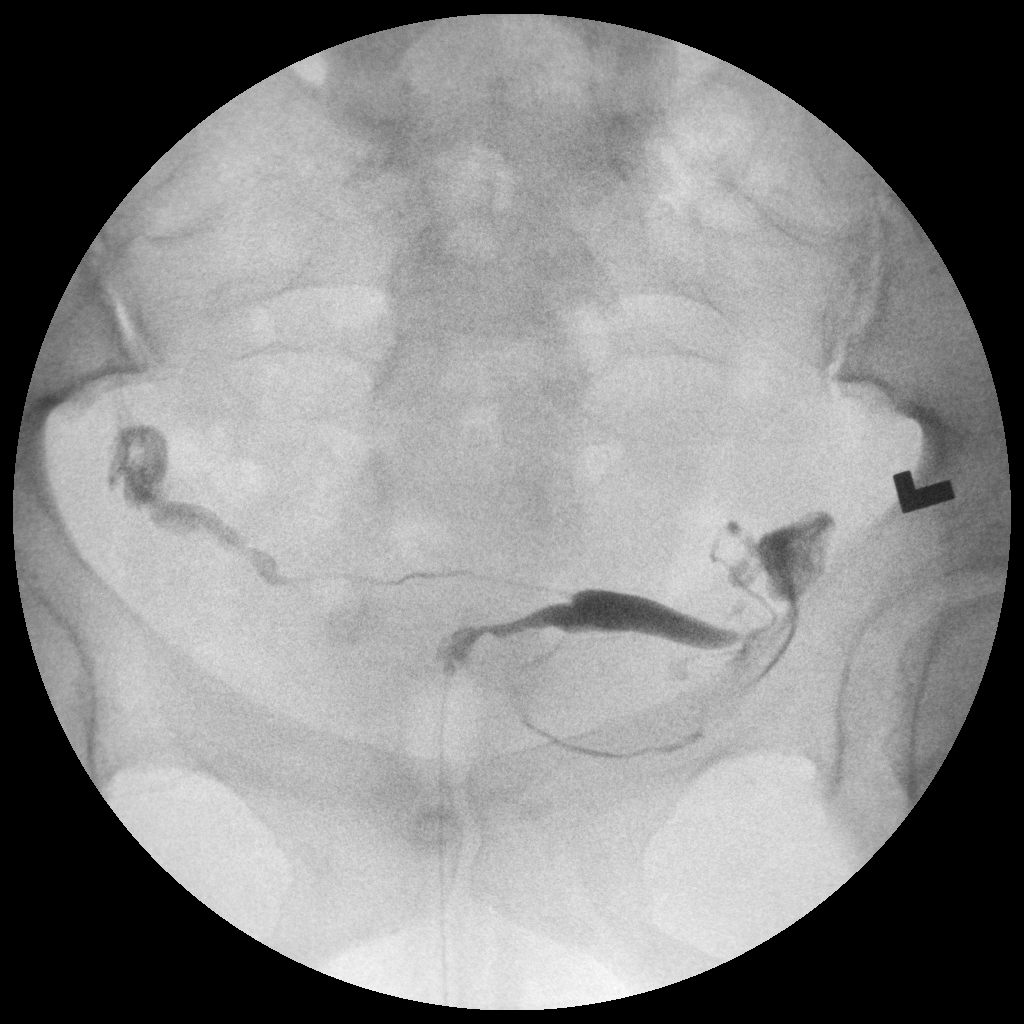

[Series 3: run · 1 of 1 slices shown (3 of 5)]
[im 1/1]
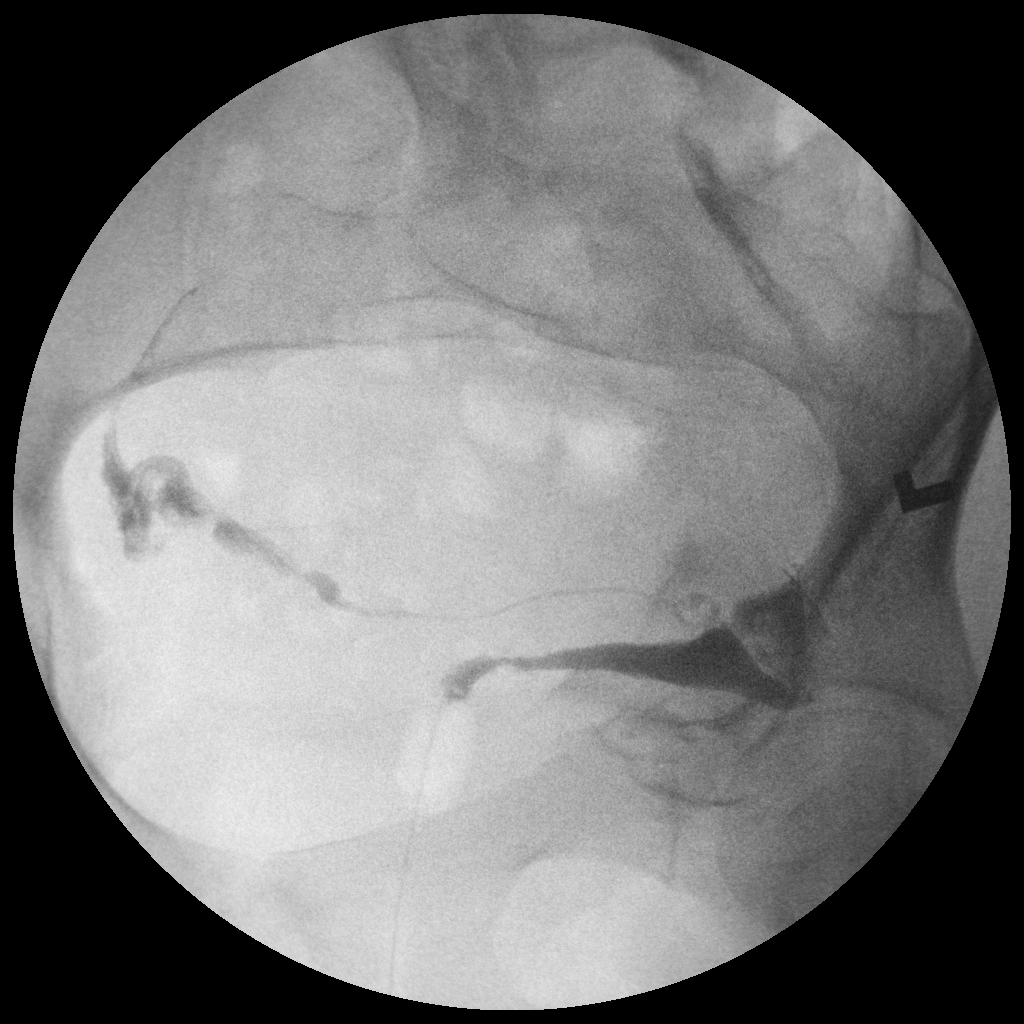

[Series 4: run · 1 of 1 slices shown (4 of 5)]
[im 1/1]
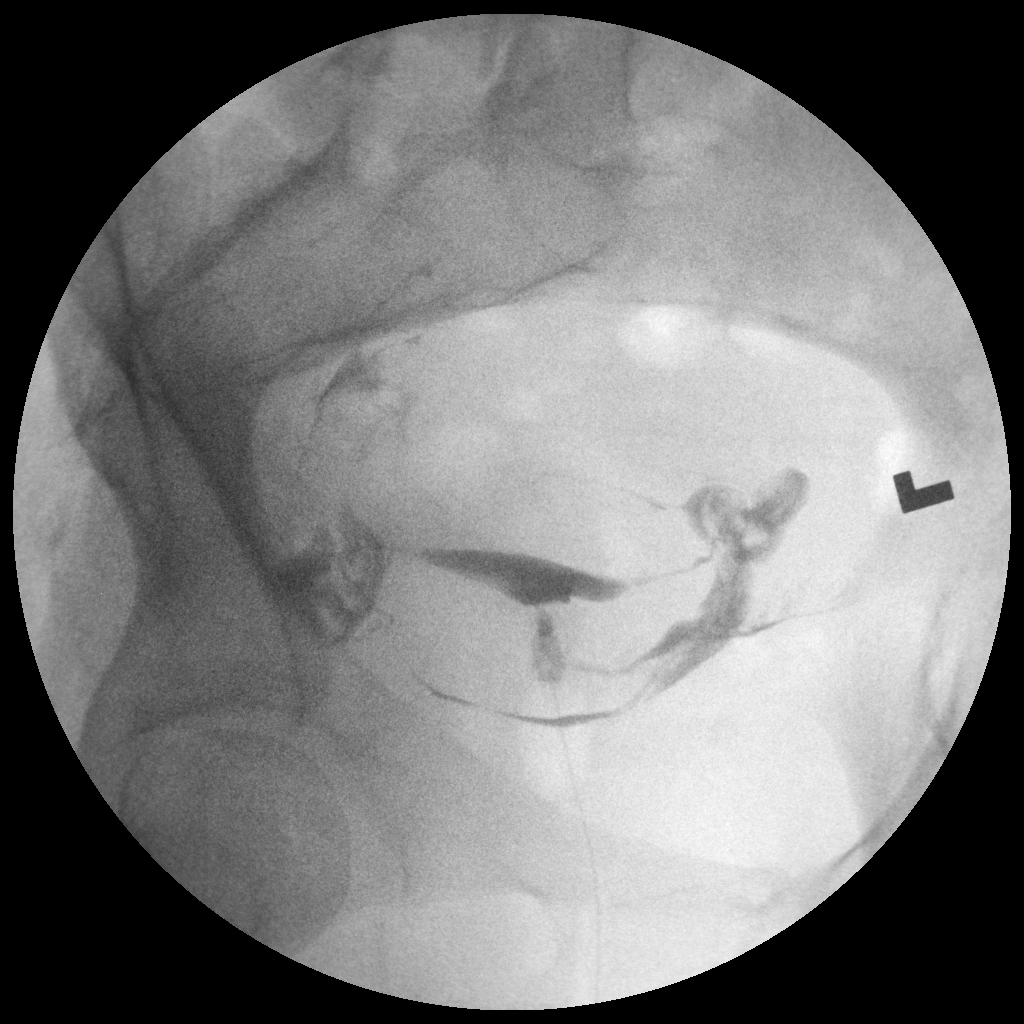

[Series 5: run · 1 of 1 slices shown (5 of 5)]
[im 1/1]
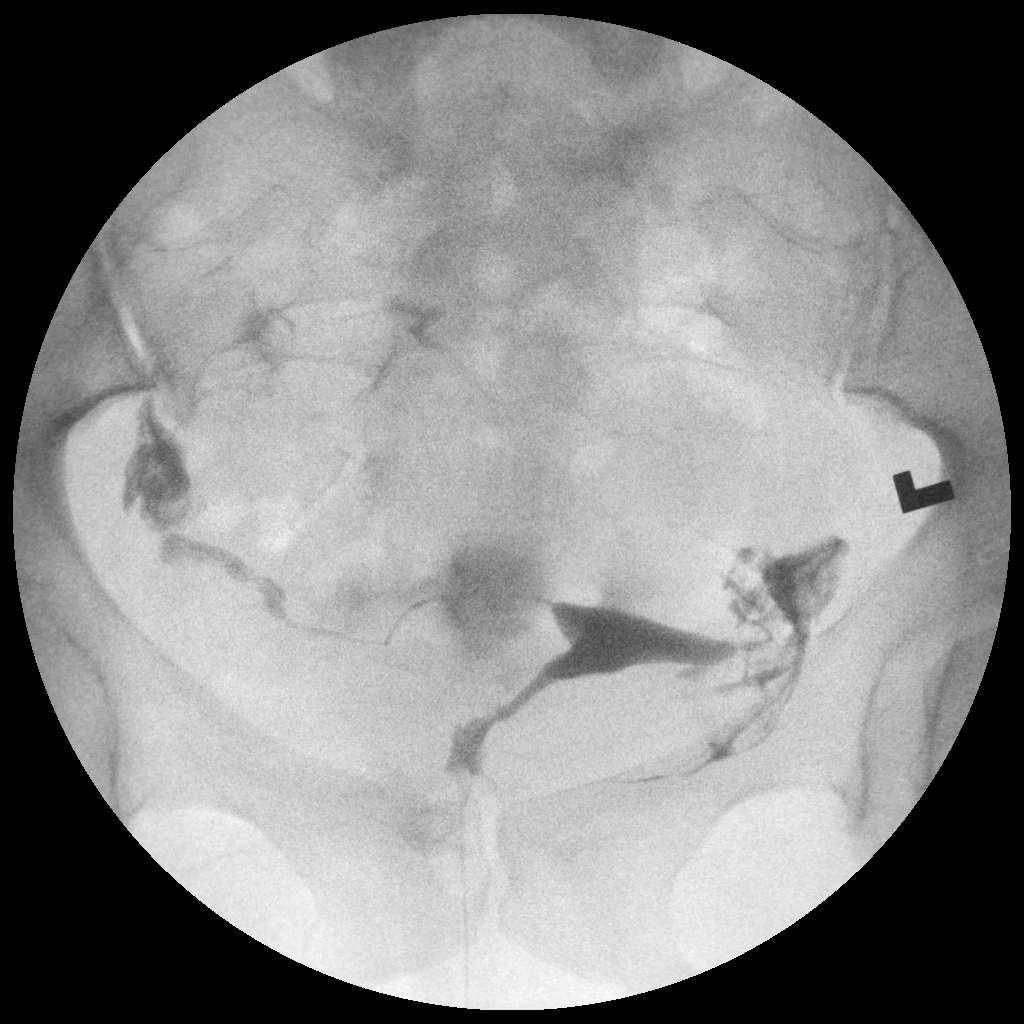

[5 of 5 positions shown; findings below may reference images not displayed]

FLUOROSCOPY TIME:  Fluoroscopy Time:  0 minutes 54 seconds

Number of Acquired Images:  0
FINDINGS: Endometrial Cavity: Normal appearance. Retroverted uterus noted,
which is also tilted to left. No signs of Mullerian duct anomaly or
other significant abnormality.

Right Fallopian Tube: Normal appearance. Free intraperitoneal spill
of contrast is demonstrated.

Left Fallopian Tube: Normal appearance. Free intraperitoneal spill
of contrast is demonstrated.

Other:  None.
IMPRESSION: Normal study. Both fallopian tubes are patent.

## 2022-10-04 LAB — OB RESULTS CONSOLE GC/CHLAMYDIA
Chlamydia: NEGATIVE
Neisseria Gonorrhea: NEGATIVE

## 2022-10-19 LAB — OB RESULTS CONSOLE RUBELLA ANTIBODY, IGM: Rubella: NON-IMMUNE/NOT IMMUNE

## 2022-10-19 LAB — OB RESULTS CONSOLE ABO/RH: RH Type: POSITIVE

## 2022-10-19 LAB — OB RESULTS CONSOLE ANTIBODY SCREEN: Antibody Screen: NEGATIVE

## 2022-10-19 LAB — OB RESULTS CONSOLE HIV ANTIBODY (ROUTINE TESTING): HIV: NONREACTIVE

## 2022-10-19 LAB — HEPATITIS C ANTIBODY: HCV Ab: NEGATIVE

## 2022-10-19 LAB — OB RESULTS CONSOLE RPR: RPR: NONREACTIVE

## 2022-10-19 LAB — OB RESULTS CONSOLE HEPATITIS B SURFACE ANTIGEN: Hepatitis B Surface Ag: NEGATIVE

## 2022-10-27 ENCOUNTER — Other Ambulatory Visit: Payer: Self-pay

## 2022-11-24 ENCOUNTER — Other Ambulatory Visit: Payer: Self-pay | Admitting: Obstetrics and Gynecology

## 2022-11-24 DIAGNOSIS — Z363 Encounter for antenatal screening for malformations: Secondary | ICD-10-CM

## 2022-12-27 ENCOUNTER — Encounter: Payer: Self-pay | Admitting: *Deleted

## 2023-01-04 ENCOUNTER — Ambulatory Visit: Payer: Commercial Managed Care - PPO | Attending: Obstetrics and Gynecology

## 2023-01-04 DIAGNOSIS — Z363 Encounter for antenatal screening for malformations: Secondary | ICD-10-CM | POA: Diagnosis present

## 2023-03-06 ENCOUNTER — Inpatient Hospital Stay (HOSPITAL_COMMUNITY)
Admission: AD | Admit: 2023-03-06 | Discharge: 2023-03-06 | Disposition: A | Discharge status: Home / Self Care | Payer: Commercial Managed Care - PPO | Attending: Obstetrics and Gynecology | Admitting: Obstetrics and Gynecology

## 2023-03-06 ENCOUNTER — Encounter (HOSPITAL_COMMUNITY): Payer: Self-pay | Admitting: Obstetrics and Gynecology

## 2023-03-06 DIAGNOSIS — M62838 Other muscle spasm: Secondary | ICD-10-CM

## 2023-03-06 DIAGNOSIS — M542 Cervicalgia: Secondary | ICD-10-CM | POA: Diagnosis not present

## 2023-03-06 DIAGNOSIS — Y92410 Unspecified street and highway as the place of occurrence of the external cause: Secondary | ICD-10-CM | POA: Diagnosis not present

## 2023-03-06 DIAGNOSIS — Z3A30 30 weeks gestation of pregnancy: Secondary | ICD-10-CM | POA: Diagnosis not present

## 2023-03-06 DIAGNOSIS — O26893 Other specified pregnancy related conditions, third trimester: Secondary | ICD-10-CM | POA: Insufficient documentation

## 2023-03-06 HISTORY — DX: Other specified health status: Z78.9
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
## 2023-04-04 ENCOUNTER — Other Ambulatory Visit: Payer: Self-pay | Admitting: Obstetrics and Gynecology

## 2023-04-04 DIAGNOSIS — Z3490 Encounter for supervision of normal pregnancy, unspecified, unspecified trimester: Secondary | ICD-10-CM

## 2023-04-11 LAB — OB RESULTS CONSOLE GBS: GBS: NEGATIVE

## 2023-04-24 ENCOUNTER — Encounter (HOSPITAL_COMMUNITY): Payer: Self-pay

## 2023-04-24 NOTE — Patient Instructions (Addendum)
Alexa Hudson  04/24/2023   Your procedure is scheduled on:  05/08/2023  Arrive at 1000 at Entrance C on [employer] at [facility]  and [employer]. You are invited to use the FREE valet parking or use the Visitor's parking deck.  Pick up the phone at the desk and dial [phone].  Call this number if you have problems the morning of surgery: [phone]  Remember:   Do not eat food:(After Midnight) Desps de medianoche.  Do not drink clear liquids: (After Midnight) Desps de medianoche.  Take these medicines the morning of surgery with A SIP OF WATER:  none   Do not wear jewelry, make-up or nail polish.  Do not wear lotions, powders, or perfumes. Do not wear deodorant.  Do not shave 48 hours prior to surgery.  Do not bring valuables to the hospital.  [facility] is not   responsible for any belongings or valuables brought to the hospital.  Contacts, dentures or bridgework may not be worn into surgery.  Leave suitcase in the car. After surgery it may be brought to your room.  For patients admitted to the hospital, checkout time is 11:00 AM the day of              discharge.      Please read over the following fact sheets that you were given:     Preparing for Surgery

## 2023-05-07 ENCOUNTER — Encounter (HOSPITAL_COMMUNITY)
Admission: RE | Admit: 2023-05-07 | Discharge: 2023-05-07 | Disposition: A | Discharge status: Home / Self Care | Payer: Commercial Managed Care - PPO | Source: Ambulatory Visit | Attending: Obstetrics and Gynecology | Admitting: Obstetrics and Gynecology

## 2023-05-07 DIAGNOSIS — O34219 Maternal care for unspecified type scar from previous cesarean delivery: Secondary | ICD-10-CM | POA: Insufficient documentation

## 2023-05-07 DIAGNOSIS — Z3A39 39 weeks gestation of pregnancy: Secondary | ICD-10-CM | POA: Insufficient documentation

## 2023-05-07 DIAGNOSIS — Z3490 Encounter for supervision of normal pregnancy, unspecified, unspecified trimester: Secondary | ICD-10-CM

## 2023-05-07 LAB — CBC
HCT: 37.3 % (ref 36.0–46.0)
Hemoglobin: 12.3 g/dL (ref 12.0–15.0)
MCH: 28.5 pg (ref 26.0–34.0)
MCHC: 33 g/dL (ref 30.0–36.0)
MCV: 86.5 fL (ref 80.0–100.0)
Platelets: 221 10*3/uL (ref 150–400)
RBC: 4.31 MIL/uL (ref 3.87–5.11)
RDW: 13.3 % (ref 11.5–15.5)
WBC: 10.2 10*3/uL (ref 4.0–10.5)
nRBC: 0 % (ref 0.0–0.2)

## 2023-05-07 LAB — TYPE AND SCREEN
ABO/RH(D): B POS
Antibody Screen: NEGATIVE

## 2023-05-07 NOTE — Anesthesia Preprocedure Evaluation (Signed)
Anesthesia Evaluation  Patient identified by MRN, date of birth, ID band Patient awake    Reviewed: Allergy & Precautions, NPO status , Patient's Chart, lab work & pertinent test results  Airway Mallampati: II       Dental no notable dental hx. (+) Teeth Intact   Pulmonary neg pulmonary ROS   Pulmonary exam normal breath sounds clear to auscultation       Cardiovascular negative cardio ROS Normal cardiovascular exam Rhythm:Regular Rate:Normal     Neuro/Psych negative neurological ROS  negative psych ROS   GI/Hepatic Neg liver ROS,GERD  ,,  Endo/Other  negative endocrine ROS    Renal/GU negative Renal ROS  negative genitourinary   Musculoskeletal negative musculoskeletal ROS (+)    Abdominal   Peds  Hematology negative hematology ROS (+)   Anesthesia Other Findings   Reproductive/Obstetrics Hx/o Previous C/Section                             Anesthesia Physical Anesthesia Plan  ASA: 2  Anesthesia Plan: Spinal   Post-op Pain Management: Minimal or no pain anticipated   Induction: Intravenous  PONV Risk Score and Plan: 4 or greater and Treatment may vary due to age or medical condition  Airway Management Planned: Natural Airway  Additional Equipment:   Intra-op Plan:   Post-operative Plan:   Informed Consent: I have reviewed the patients History and Physical, chart, labs and discussed the procedure including the risks, benefits and alternatives for the proposed anesthesia with the patient or authorized representative who has indicated his/her understanding and acceptance.     Dental advisory given  Plan Discussed with: CRNA and Anesthesiologist  Anesthesia Plan Comments:         Anesthesia Quick Evaluation

## 2023-05-08 ENCOUNTER — Inpatient Hospital Stay (HOSPITAL_COMMUNITY): Payer: Commercial Managed Care - PPO | Admitting: Anesthesiology

## 2023-05-08 ENCOUNTER — Other Ambulatory Visit: Payer: Self-pay

## 2023-05-08 ENCOUNTER — Inpatient Hospital Stay (HOSPITAL_COMMUNITY)
Admission: AD | Admit: 2023-05-08 | Discharge: 2023-05-10 | DRG: 788 | Disposition: A | Discharge status: Home / Self Care | Payer: Commercial Managed Care - PPO | Attending: Obstetrics and Gynecology | Admitting: Obstetrics and Gynecology

## 2023-05-08 ENCOUNTER — Encounter (HOSPITAL_COMMUNITY)
Admission: AD | Disposition: A | Discharge status: Home / Self Care | Payer: Self-pay | Source: Home / Self Care | Attending: Obstetrics and Gynecology

## 2023-05-08 ENCOUNTER — Encounter (HOSPITAL_COMMUNITY): Payer: Self-pay | Admitting: Obstetrics and Gynecology

## 2023-05-08 DIAGNOSIS — Z23 Encounter for immunization: Secondary | ICD-10-CM

## 2023-05-08 DIAGNOSIS — D259 Leiomyoma of uterus, unspecified: Secondary | ICD-10-CM | POA: Diagnosis present

## 2023-05-08 DIAGNOSIS — Z3A39 39 weeks gestation of pregnancy: Secondary | ICD-10-CM

## 2023-05-08 DIAGNOSIS — O3413 Maternal care for benign tumor of corpus uteri, third trimester: Secondary | ICD-10-CM | POA: Diagnosis present

## 2023-05-08 DIAGNOSIS — O34211 Maternal care for low transverse scar from previous cesarean delivery: Secondary | ICD-10-CM | POA: Diagnosis present

## 2023-05-08 DIAGNOSIS — Z3490 Encounter for supervision of normal pregnancy, unspecified, unspecified trimester: Secondary | ICD-10-CM

## 2023-05-08 SURGERY — Surgical Case
Anesthesia: Spinal

## 2023-05-08 MED ORDER — MEPERIDINE HCL 25 MG/ML IJ SOLN: 6.2500 mg | INTRAMUSCULAR | Status: DC | PRN

## 2023-05-08 MED ORDER — STERILE WATER FOR IRRIGATION IR SOLN
Status: DC | PRN
  Administered 2023-05-08: 1000 mL

## 2023-05-08 MED ORDER — CEFAZOLIN SODIUM-DEXTROSE 2-4 GM/100ML-% IV SOLN
INTRAVENOUS | Status: AC
  Filled 2023-05-08: qty 100

## 2023-05-08 MED ORDER — ZOLPIDEM TARTRATE 5 MG PO TABS: 5.0000 mg | ORAL_TABLET | Freq: Every evening | ORAL | Status: DC | PRN

## 2023-05-08 MED ORDER — ONDANSETRON HCL 4 MG/2ML IJ SOLN: 4.0000 mg | Freq: Three times a day (TID) | INTRAMUSCULAR | Status: DC | PRN

## 2023-05-08 MED ORDER — ACETAMINOPHEN 10 MG/ML IV SOLN
INTRAVENOUS | Status: DC | PRN
  Administered 2023-05-08: 1000 mg via INTRAVENOUS

## 2023-05-08 MED ORDER — OXYTOCIN-SODIUM CHLORIDE 30-0.9 UT/500ML-% IV SOLN
INTRAVENOUS | Status: DC | PRN
  Administered 2023-05-08: 200 mL via INTRAVENOUS

## 2023-05-08 MED ORDER — CEFAZOLIN SODIUM-DEXTROSE 2-4 GM/100ML-% IV SOLN
2.0000 g | INTRAVENOUS | Status: AC
  Administered 2023-05-08: 2 g via INTRAVENOUS

## 2023-05-08 MED ORDER — DEXAMETHASONE SODIUM PHOSPHATE 4 MG/ML IJ SOLN
INTRAMUSCULAR | Status: AC
  Filled 2023-05-08: qty 2

## 2023-05-08 MED ORDER — SIMETHICONE 80 MG PO CHEW: 80.0000 mg | CHEWABLE_TABLET | ORAL | Status: DC | PRN

## 2023-05-08 MED ORDER — IBUPROFEN 600 MG PO TABS
600.0000 mg | ORAL_TABLET | Freq: Four times a day (QID) | ORAL | Status: DC
  Administered 2023-05-08 – 2023-05-10 (×7): 600 mg via ORAL
  Filled 2023-05-08 (×7): qty 1

## 2023-05-08 MED ORDER — MENTHOL 3 MG MT LOZG: 1.0000 | LOZENGE | OROMUCOSAL | Status: DC | PRN

## 2023-05-08 MED ORDER — MORPHINE SULFATE (PF) 0.5 MG/ML IJ SOLN
INTRAMUSCULAR | Status: AC
  Filled 2023-05-08: qty 10

## 2023-05-08 MED ORDER — WITCH HAZEL-GLYCERIN EX PADS: 1.0000 | MEDICATED_PAD | CUTANEOUS | Status: DC | PRN

## 2023-05-08 MED ORDER — ACETAMINOPHEN 500 MG PO TABS
1000.0000 mg | ORAL_TABLET | Freq: Four times a day (QID) | ORAL | Status: DC
  Administered 2023-05-08 – 2023-05-10 (×7): 1000 mg via ORAL
  Filled 2023-05-08 (×7): qty 2

## 2023-05-08 MED ORDER — OXYTOCIN 10 UNIT/ML IJ SOLN
INTRAMUSCULAR | Status: AC
  Filled 2023-05-08: qty 1

## 2023-05-08 MED ORDER — DEXAMETHASONE SODIUM PHOSPHATE 10 MG/ML IJ SOLN
INTRAMUSCULAR | Status: DC | PRN
  Administered 2023-05-08: 8 mg via INTRAVENOUS

## 2023-05-08 MED ORDER — OXYTOCIN-SODIUM CHLORIDE 30-0.9 UT/500ML-% IV SOLN
2.5000 [IU]/h | INTRAVENOUS | Status: AC
  Administered 2023-05-08: 2.5 [IU]/h via INTRAVENOUS
  Filled 2023-05-08: qty 500

## 2023-05-08 MED ORDER — SOD CITRATE-CITRIC ACID 500-334 MG/5ML PO SOLN
ORAL | Status: AC
  Filled 2023-05-08: qty 30

## 2023-05-08 MED ORDER — METHYLERGONOVINE MALEATE 0.2 MG/ML IJ SOLN: 0.2000 mg | INTRAMUSCULAR | Status: DC | PRN

## 2023-05-08 MED ORDER — ONDANSETRON HCL 4 MG/2ML IJ SOLN
INTRAMUSCULAR | Status: AC
  Filled 2023-05-08: qty 2

## 2023-05-08 MED ORDER — KETOROLAC TROMETHAMINE 30 MG/ML IJ SOLN
INTRAMUSCULAR | Status: AC
  Filled 2023-05-08: qty 1

## 2023-05-08 MED ORDER — DIBUCAINE (PERIANAL) 1 % EX OINT: 1.0000 | TOPICAL_OINTMENT | CUTANEOUS | Status: DC | PRN

## 2023-05-08 MED ORDER — DIPHENHYDRAMINE HCL 25 MG PO CAPS: 25.0000 mg | ORAL_CAPSULE | ORAL | Status: DC | PRN

## 2023-05-08 MED ORDER — KETOROLAC TROMETHAMINE 30 MG/ML IJ SOLN
30.0000 mg | Freq: Four times a day (QID) | INTRAMUSCULAR | Status: AC | PRN
  Administered 2023-05-08: 30 mg via INTRAVENOUS

## 2023-05-08 MED ORDER — SCOPOLAMINE 1 MG/3DAYS TD PT72
1.0000 | MEDICATED_PATCH | TRANSDERMAL | Status: DC
  Administered 2023-05-08: 1.5 mg via TRANSDERMAL

## 2023-05-08 MED ORDER — BUPIVACAINE IN DEXTROSE 0.75-8.25 % IT SOLN
INTRATHECAL | Status: DC | PRN
  Administered 2023-05-08: 1.7 mL via INTRATHECAL

## 2023-05-08 MED ORDER — DEXMEDETOMIDINE HCL IN NACL 80 MCG/20ML IV SOLN
INTRAVENOUS | Status: AC
  Filled 2023-05-08: qty 20

## 2023-05-08 MED ORDER — SIMETHICONE 80 MG PO CHEW
80.0000 mg | CHEWABLE_TABLET | Freq: Three times a day (TID) | ORAL | Status: DC
  Administered 2023-05-08 – 2023-05-09 (×4): 80 mg via ORAL
  Filled 2023-05-08 (×4): qty 1

## 2023-05-08 MED ORDER — EPHEDRINE SULFATE-NACL 50-0.9 MG/10ML-% IV SOSY
PREFILLED_SYRINGE | INTRAVENOUS | Status: DC | PRN
  Administered 2023-05-08: 10 mg via INTRAVENOUS
  Administered 2023-05-08: 5 mg via INTRAVENOUS

## 2023-05-08 MED ORDER — METHYLERGONOVINE MALEATE 0.2 MG PO TABS: 0.2000 mg | ORAL_TABLET | ORAL | Status: DC | PRN

## 2023-05-08 MED ORDER — PHENYLEPHRINE HCL (PRESSORS) 10 MG/ML IV SOLN
INTRAVENOUS | Status: DC | PRN
  Administered 2023-05-08: 240 ug via INTRAVENOUS
  Administered 2023-05-08 (×2): 160 ug via INTRAVENOUS
  Administered 2023-05-08: 240 ug via INTRAVENOUS
  Administered 2023-05-08: 160 ug via INTRAVENOUS

## 2023-05-08 MED ORDER — TRANEXAMIC ACID-NACL 1000-0.7 MG/100ML-% IV SOLN
1000.0000 mg | Freq: Once | INTRAVENOUS | Status: AC
  Administered 2023-05-08: 1000 mg via INTRAVENOUS

## 2023-05-08 MED ORDER — SENNOSIDES-DOCUSATE SODIUM 8.6-50 MG PO TABS
2.0000 | ORAL_TABLET | Freq: Every day | ORAL | Status: DC
  Administered 2023-05-09: 2 via ORAL
  Filled 2023-05-08: qty 2

## 2023-05-08 MED ORDER — NALOXONE HCL 0.4 MG/ML IJ SOLN: 0.4000 mg | INTRAMUSCULAR | Status: DC | PRN

## 2023-05-08 MED ORDER — METOCLOPRAMIDE HCL 5 MG/ML IJ SOLN
INTRAMUSCULAR | Status: AC
  Filled 2023-05-08: qty 2

## 2023-05-08 MED ORDER — SODIUM CHLORIDE 0.9% FLUSH: 3.0000 mL | INTRAVENOUS | Status: DC | PRN

## 2023-05-08 MED ORDER — NALOXONE HCL 4 MG/10ML IJ SOLN: 1.0000 ug/kg/h | INTRAVENOUS | Status: DC | PRN

## 2023-05-08 MED ORDER — SCOPOLAMINE 1 MG/3DAYS TD PT72: MEDICATED_PATCH | TRANSDERMAL | Status: DC | PRN

## 2023-05-08 MED ORDER — KETOROLAC TROMETHAMINE 30 MG/ML IJ SOLN: 30.0000 mg | Freq: Four times a day (QID) | INTRAMUSCULAR | Status: AC | PRN

## 2023-05-08 MED ORDER — COCONUT OIL OIL: 1.0000 | TOPICAL_OIL | Status: DC | PRN

## 2023-05-08 MED ORDER — FENTANYL CITRATE (PF) 100 MCG/2ML IJ SOLN: 25.0000 ug | INTRAMUSCULAR | Status: DC | PRN

## 2023-05-08 MED ORDER — SCOPOLAMINE 1 MG/3DAYS TD PT72
MEDICATED_PATCH | TRANSDERMAL | Status: AC
  Filled 2023-05-08: qty 1

## 2023-05-08 MED ORDER — POVIDONE-IODINE 10 % EX SWAB
2.0000 | Freq: Once | CUTANEOUS | Status: AC
  Administered 2023-05-08: 2 via TOPICAL

## 2023-05-08 MED ORDER — MORPHINE SULFATE (PF) 0.5 MG/ML IJ SOLN
INTRAMUSCULAR | Status: DC | PRN
  Administered 2023-05-08: 150 ug via INTRATHECAL

## 2023-05-08 MED ORDER — SOD CITRATE-CITRIC ACID 500-334 MG/5ML PO SOLN
30.0000 mL | Freq: Once | ORAL | Status: AC
  Administered 2023-05-08: 30 mL via ORAL

## 2023-05-08 MED ORDER — ONDANSETRON HCL 4 MG/2ML IJ SOLN
INTRAMUSCULAR | Status: DC | PRN
  Administered 2023-05-08: 4 mg via INTRAVENOUS

## 2023-05-08 MED ORDER — FENTANYL CITRATE (PF) 100 MCG/2ML IJ SOLN
INTRAMUSCULAR | Status: AC
  Filled 2023-05-08: qty 2

## 2023-05-08 MED ORDER — DIPHENHYDRAMINE HCL 25 MG PO CAPS: 25.0000 mg | ORAL_CAPSULE | Freq: Four times a day (QID) | ORAL | Status: DC | PRN

## 2023-05-08 MED ORDER — FENTANYL CITRATE (PF) 100 MCG/2ML IJ SOLN
INTRAMUSCULAR | Status: DC | PRN
  Administered 2023-05-08: 15 ug via INTRATHECAL

## 2023-05-08 MED ORDER — TRANEXAMIC ACID-NACL 1000-0.7 MG/100ML-% IV SOLN
INTRAVENOUS | Status: AC
  Filled 2023-05-08: qty 100

## 2023-05-08 MED ORDER — LACTATED RINGERS IV SOLN: INTRAVENOUS | Status: DC

## 2023-05-08 MED ORDER — PHENYLEPHRINE HCL-NACL 20-0.9 MG/250ML-% IV SOLN
INTRAVENOUS | Status: DC | PRN
  Administered 2023-05-08: 60 ug/min via INTRAVENOUS

## 2023-05-08 MED ORDER — DIPHENHYDRAMINE HCL 50 MG/ML IJ SOLN: 12.5000 mg | INTRAMUSCULAR | Status: DC | PRN

## 2023-05-08 MED ORDER — PHENYLEPHRINE HCL-NACL 20-0.9 MG/250ML-% IV SOLN
INTRAVENOUS | Status: AC
  Filled 2023-05-08: qty 250

## 2023-05-08 MED ORDER — METOCLOPRAMIDE HCL 5 MG/ML IJ SOLN
INTRAMUSCULAR | Status: DC | PRN
  Administered 2023-05-08: 10 mg via INTRAVENOUS

## 2023-05-08 MED ORDER — PRENATAL MULTIVITAMIN CH
1.0000 | ORAL_TABLET | Freq: Every day | ORAL | Status: DC
  Administered 2023-05-09: 1 via ORAL
  Filled 2023-05-08: qty 1

## 2023-05-08 SURGICAL SUPPLY — 32 items
ADH SKN CLS APL DERMABOND .7 (GAUZE/BANDAGES/DRESSINGS) ×1
APL PRP STRL LF DISP 70% ISPRP (MISCELLANEOUS) ×2
CHLORAPREP W/TINT 26 (MISCELLANEOUS) ×2 IMPLANT
CLAMP UMBILICAL CORD (MISCELLANEOUS) ×1 IMPLANT
CLIP FILSHIE TUBAL LIGA STRL (Clip) IMPLANT
CLOTH BEACON ORANGE TIMEOUT ST (SAFETY) ×1 IMPLANT
DERMABOND ADVANCED .7 DNX12 (GAUZE/BANDAGES/DRESSINGS) IMPLANT
DRSG OPSITE POSTOP 4X10 (GAUZE/BANDAGES/DRESSINGS) ×1 IMPLANT
ELECT REM PT RETURN 9FT ADLT (ELECTROSURGICAL) ×1
ELECTRODE REM PT RTRN 9FT ADLT (ELECTROSURGICAL) ×1 IMPLANT
EXTRACTOR VACUUM KIWI (MISCELLANEOUS) IMPLANT
GLOVE BIO SURGEON STRL SZ7 (GLOVE) ×1 IMPLANT
GLOVE BIOGEL PI IND STRL 7.0 (GLOVE) ×1 IMPLANT
GOWN STRL REUS W/TWL LRG LVL3 (GOWN DISPOSABLE) ×2 IMPLANT
KIT ABG SYR 3ML LUER SLIP (SYRINGE) IMPLANT
NDL HYPO 25X5/8 SAFETYGLIDE (NEEDLE) IMPLANT
NEEDLE HYPO 22GX1.5 SAFETY (NEEDLE) IMPLANT
NEEDLE HYPO 25X5/8 SAFETYGLIDE (NEEDLE) IMPLANT
NS IRRIG 1000ML POUR BTL (IV SOLUTION) ×1 IMPLANT
PACK C SECTION WH (CUSTOM PROCEDURE TRAY) ×1 IMPLANT
PAD OB MATERNITY 4.3X12.25 (PERSONAL CARE ITEMS) ×1 IMPLANT
RTRCTR C-SECT PINK 25CM LRG (MISCELLANEOUS) ×1 IMPLANT
SUT CHROMIC 1 CTX 36 (SUTURE) ×2 IMPLANT
SUT CHROMIC 2 0 CT 1 (SUTURE) ×1 IMPLANT
SUT PDS AB 0 CTX 60 (SUTURE) ×1 IMPLANT
SUT VIC AB 2-0 CT1 27 (SUTURE) ×1
SUT VIC AB 2-0 CT1 TAPERPNT 27 (SUTURE) ×1 IMPLANT
SUT VIC AB 4-0 KS 27 (SUTURE) IMPLANT
SYR 30ML LL (SYRINGE) IMPLANT
TOWEL OR 17X24 6PK STRL BLUE (TOWEL DISPOSABLE) ×1 IMPLANT
TRAY FOLEY W/BAG SLVR 14FR LF (SET/KITS/TRAYS/PACK) ×1 IMPLANT
WATER STERILE IRR 1000ML POUR (IV SOLUTION) ×1 IMPLANT

## 2023-05-08 NOTE — H&P (Signed)
Alexa Hudson is a 32 y.o. female presenting for scheduled repeat C-section  32 year old G2 P1-0-0-1 at 39+1 presents for scheduled repeat cesarean section due to patient desire for repeat.  Her pregnancy has been uncomplicated to this point. OB History     Gravida  2   Para  1   Term  1   Preterm  0   AB  0   Living  1      SAB  0   IAB  0   Ectopic  0   Multiple  0   Live Births  1          Past Medical History:  Diagnosis Date   Medical history non-contributory    Past Surgical History:  Procedure Laterality Date   CESAREAN SECTION N/A 07/25/2018   Procedure: CESAREAN SECTION;  Surgeon: Philip Aspen, DO;  Location: [facility];  Service: Obstetrics;  Laterality: N/A;   WISDOM TOOTH EXTRACTION  01/2013   WISDOM TOOTH EXTRACTION  2014   Family History: family history includes Cancer (age of onset: 82) in an other family member. Social History:  reports that she has never smoked. She has never used smokeless tobacco. She reports that she does not drink alcohol and does not use drugs.     Maternal Diabetes: No Genetic Screening: Normal Maternal Ultrasounds/Referrals: Normal Fetal Ultrasounds or other Referrals:  None Maternal Substance Abuse:  No Significant Maternal Medications:  None Significant Maternal Lab Results:  Other: Rubella nonimmune, plan to vaccinate postpartum Number of Prenatal Visits:greater than 3 verified prenatal visits Other Comments:  None  Review of Systems History   Blood pressure 133/86, temperature 98.2 F (36.8 C), temperature source Oral, resp. rate 18, height 5\' 3"  (1.6 m), weight 66.7 kg, last menstrual period 08/07/2022, SpO2 99%, currently breastfeeding. Exam Physical Exam  Alert and oriented x 3, NAD Gravid, soft Normal work of breathing Prenatal labs: ABO, Rh: --/--/B POS (08/12 0935) Antibody: NEG (08/12 0935) Rubella: Nonimmune (01/25 0000) RPR: NON REACTIVE (08/12 1000)  HBsAg: Negative (01/25  0000)  HIV: Non-reactive (01/25 0000)  GBS: Negative/-- (07/17 0000)   Assessment/Plan: Admit Proceed with repeat cesarean section.  Informed consent obtained SCDs for DVT prophylaxis Ancef 2 g on-call to the OR   Waynard Reeds 05/08/2023, 11:26 AM

## 2023-05-08 NOTE — Anesthesia Procedure Notes (Signed)
Spinal  Patient location during procedure: OR Start time: 05/08/2023 11:54 AM End time: 05/08/2023 11:58 AM Reason for block: surgical anesthesia Staffing Performed: anesthesiologist  Anesthesiologist: Mal Amabile, MD Performed by: Mal Amabile, MD Authorized by: Mal Amabile, MD   Preanesthetic Checklist Completed: patient identified, IV checked, site marked, risks and benefits discussed, surgical consent, monitors and equipment checked, pre-op evaluation and timeout performed Spinal Block Patient position: sitting Prep: DuraPrep and site prepped and draped Patient monitoring: heart rate, cardiac monitor, continuous pulse ox and blood pressure Approach: midline Location: L3-4 Injection technique: single-shot Needle Needle type: Pencan  Needle gauge: 24 G Needle length: 9 cm Needle insertion depth: 6 cm Assessment Sensory level: T2 Events: CSF return Additional Notes Patient tolerated procedure well. Adequate sensory level.

## 2023-05-08 NOTE — Anesthesia Postprocedure Evaluation (Signed)
Anesthesia Post Note  Patient: Alexa Hudson  Procedure(s) Performed: CESAREAN SECTION     Patient location during evaluation: PACU Anesthesia Type: Spinal Level of consciousness: oriented and awake and alert Pain management: pain level controlled Vital Signs Assessment: post-procedure vital signs reviewed and stable Respiratory status: spontaneous breathing, respiratory function stable and nonlabored ventilation Cardiovascular status: blood pressure returned to baseline and stable Postop Assessment: no headache, no backache, no apparent nausea or vomiting, spinal receding and patient able to bend at knees Anesthetic complications: no   No notable events documented.  Last Vitals:  Vitals:   05/08/23 1400 05/08/23 1415  BP: (!) 103/54   Pulse: 67 65  Resp: 14 (!) 23  Temp:    SpO2: 96% 97%    Last Pain:  Vitals:   05/08/23 1415  TempSrc:   PainSc: 0-No pain   Pain Goal:    LLE Motor Response: No movement due to regional block (05/08/23 1415) LLE Sensation: Numbness (05/08/23 1415) RLE Motor Response: No movement due to regional block (05/08/23 1415) RLE Sensation: Numbness (05/08/23 1415)     Epidural/Spinal Function Cutaneous sensation: Vague (05/08/23 1415), Patient able to flex knees: No (05/08/23 1415), Patient able to lift hips off bed: No (05/08/23 1415), Back pain beyond tenderness at insertion site: No (05/08/23 1415), Progressively worsening motor and/or sensory loss: No (05/08/23 1415), Bowel and/or bladder incontinence post epidural: No (05/08/23 1415)  Sawyer Kahan A.

## 2023-05-08 NOTE — Op Note (Signed)
Operative Report    Pre-Operative Diagnosis: 1) 39+1-week intrauterine pregnancy 2) repeat cesarean section, elective  Postoperative Diagnosis: 1) 39+1-week intrauterine pregnancy 2) repeat cesarean section, elective  Procedure: Repeat low-transverse cesarean section  Surgeon: Dr. Waynard Reeds  Assistant: Dr. Marlow Baars  Antibiotics: Ancef 2 g  Operative Findings: Initially following spinal placement with the patient became hypotensive and bradycardic.  Medications were given and IV fluid bolus was given however she did not initially rapidly respond.  Fetal heart rate was checked at this time and found to be 127.  The patient responded to hypotensive interventions and we proceeded with the procedure.  Female infant in the vertex presentation with Apgar scores of 8 at 1 minute and 9 at 5 minutes.  Kiwi vacuum extractor was used to assist with delivery of the fetal head.  Immediately after delivery, the infant appeared to have decreased tone and did not quickly respond to stimulation therefore the decision was made to clamp and cut the umbilical cord prior to a 1 minute delay.  Ovaries and tubes were normal-appearing.  Small 1 cm fibroid noted on the anterior uterus.  Specimen: Placenta for disposal  [id number] I/O In: 2100 [I.V.:2100] Out: 604 [Urine:200; Blood:404]   Procedure:Ms. Cancilla is an 32 year old gravida 2 para 1001 at 35 weeks and 1 days estimated gestational age who presents for cesarean section. Following the appropriate informed consent the patient was brought to the operating room where spinal anesthesia was administered and found to be adequate. She was placed in the dorsal supine position with a leftward tilt. She was prepped and draped in the normal sterile fashion.  The patient was appropriately identified during a preoperative timeout procedure.  The scalpel was then used to make a Pfannenstiel skin incision which was carried down to the underlying layers of soft tissue to  the fascia. The fascia was incised in the midline and the fascial incision was extended laterally with Mayo scissors. The superior aspect of the fascial incision was grasped with Coker clamps x2, tented up and the rectus muscles dissected off sharply with the electrocautery unit. The same procedure was repeated on the inferior aspect of the fascial incision. The rectus muscles were separated in the midline. The abdominal peritoneum was identified, tented up, entered sharply, and the incision was extended superiorly and inferiorly with good visualization of the bladder. The Alexis retractor was then deployed. The vesicouterine peritoneum was identified, tented up, entered sharply, and the bladder flap was created digitally.  The scalpel was then used to make a low transverse incision on the uterus which was extended laterally with blunt dissection. The fetal vertex was identified, delivered easily through the uterine incision with the assistance of the Kiwi vacuum extractor.  No pop offs occurred. The infant was bulb suctioned on the operative field   The cord was clamped and cut. The infant was passed to the waiting neonatology team. Placenta was then delivered spontaneously and the uterus was cleared of all clot and debris. The uterine incision was repaired with #1 chromic in running locked fashion.  Several additional figure-of-eight sutures were used to achieve hemostasis.  The ovaries and tubes were inspected and normal. The Alexis retractor was removed. The abdominal peritoneum was reapproximated with 2-0 Vicryl in a running fashion. The rectus muscles was reapproximated with 2-0 chromic in a running fashion. The fascia was closed with 0-looped PDS in a running fashion.  2-0 plain gut interrupted sutures were used to reapproximate the subcutaneous tissue.  The skin was  closed with 4-0 vicryl in a subcuticular fashion and Dermabond. All laparotomy sponge, instrument, and needle counts were correct.  The patient  tolerated the procedure well and was transferred to the recovery unit in stable condition following the procedure.

## 2023-05-08 NOTE — Transfer of Care (Signed)
Immediate Anesthesia Transfer of Care Note  Patient: Alexa Hudson  Procedure(s) Performed: CESAREAN SECTION  Patient Location: PACU  Anesthesia Type:Spinal  Level of Consciousness: awake, alert , and oriented  Airway & Oxygen Therapy: Patient Spontanous Breathing  Post-op Assessment: Report given to RN and Post -op Vital signs reviewed and stable  Post vital signs: Reviewed and stable  Last Vitals:  Vitals Value Taken Time  BP 97/44 05/08/23 1308  Temp    Pulse 81 05/08/23 1309  Resp 17 05/08/23 1309  SpO2 96 % 05/08/23 1309  Vitals shown include unfiled device data.  Last Pain:  Vitals:   05/08/23 1036  TempSrc: Oral         Complications: No notable events documented.

## 2023-05-09 MED ORDER — LORATADINE 10 MG PO TABS
10.0000 mg | ORAL_TABLET | Freq: Every day | ORAL | Status: DC
  Administered 2023-05-09: 10 mg via ORAL
  Filled 2023-05-09: qty 1

## 2023-05-09 NOTE — Progress Notes (Signed)
Subjective: Postpartum Day #1: Cesarean Delivery Patient reports incisional pain, tolerating PO, and no problems voiding.    Objective: Vital signs in last 24 hours: Temp:  [97.6 F (36.4 C)-98.2 F (36.8 C)] 97.8 F (36.6 C) (08/14 0619) Pulse Rate:  [56-81] 62 (08/14 0619) Resp:  [14-23] 16 (08/14 0619) BP: (95-123)/(44-88) 121/63 (08/14 0619) SpO2:  [95 %-100 %] 100 % (08/14 4034)  Physical Exam:  General: alert Lochia: appropriate Uterine Fundus: firm Incision: dressing C/D/I  Recent Labs    05/07/23 1000 05/09/23 0506  HGB 12.3 9.0*  HCT 37.3 26.7*    Assessment/Plan: Status post Cesarean section. Doing well postoperatively.  Continue current care, ambulate.  Zenaida Niece, MD 05/09/2023, 10:47 AM
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
## 2023-05-09 NOTE — Lactation Note (Addendum)
This note was copied from a baby's chart. Lactation Consultation Note  Patient Name: Alexa Hudson WRUEA'V Date: 05/09/2023 Age:34 hours Reason for consult: Follow-up assessment;Term;Infant weight loss (weight loss -1.04%, Infant is being breast and formula fed.) LC observed infant has BF 2x and been formula feeding 4 times, see flow sheet. Birth Parent is working on Orthoptist, Birth Parent latched infant on her right breast using the football hold sitting on couch, LC asssited with lowering infant's jaw to help with deeper latch and flanging infant's top lip outward, infant was on and off the breast for 10 minutes. LC observed that Birth Parent has milk bleb ( plug duct) on her right breast in the 12 o'clock position, infant has a upper labial frenulum which is tight connected to his top lip and gums,  and recessive chin.  Birth Parent informed LC her first child had tongue tie and she had latch difficulties so she exclusively pump and did not latch infant to breast. LC discussed infant's input and output, infant had 4 voids and 6 stools.   Current feeding plan: 1- Birth Parent plans to latch infant first for every feeding by cues, on demand, 8 to 12+ times within 24 hours, skin to skin.  2- Birth Parent will ask RN/LC for further latch assistance, Birth Parent knows to lower infant jaw for deeper latch due recessive chin and flange top lip outward. 3- Birth Parent knows to break latch if feel pain, pinching or discomfort and re-latch infant at the breast. 4- Birth Parent feeding choice is to supplement infant with formula after latching infant at the breast, Birth Parent was given handout with  "Feeding Guidelines " based on infant age and hours of life.  5. Birth Parent will use DEBP every 3 hours for 15 minutes on initial setting and give back any EBM first and then offer formula. Maternal Data    Feeding Mother's Current Feeding Choice: Breast Milk and Formula  LATCH Score Latch:  Grasps breast easily, tongue down, lips flanged, rhythmical sucking. (Birth Parent has milk bleb in the 12 o'clock position.)  Audible Swallowing: A few with stimulation  Type of Nipple: Everted at rest and after stimulation  Comfort (Breast/Nipple): Soft / non-tender  Hold (Positioning): Assistance needed to correctly position infant at breast and maintain latch.  LATCH Score: 8   Lactation Tools Discussed/Used    Interventions Interventions: Adjust position;Support pillows;Assisted with latch;Breast feeding basics reviewed;Skin to skin;Position options;Education;Breast massage;Breast compression;LC Services brochure  Discharge Pump: DEBP;Personal  Consult Status Consult Status: Follow-up Date: 05/10/23 Follow-up type: In-patient    Frederico Hamman 05/09/2023, 6:38 PM

## 2023-05-10 MED ORDER — MEASLES, MUMPS & RUBELLA VAC IJ SOLR
0.5000 mL | Freq: Once | INTRAMUSCULAR | Status: AC
  Administered 2023-05-10: 0.5 mL via SUBCUTANEOUS
  Filled 2023-05-10: qty 0.5

## 2023-05-10 MED ORDER — IBUPROFEN 800 MG PO TABS: 800.0000 mg | ORAL_TABLET | Freq: Three times a day (TID) | ORAL | 1 refills | Status: AC | PRN

## 2023-05-10 NOTE — Discharge Summary (Signed)
Postpartum Discharge Summary  Date of Service updated     Patient Name: Alexa Hudson DOB: [birth year] MRN: [id number]  Date of admission: 05/08/2023 Delivery date:05/08/2023 Delivering provider: Waynard Reeds Date of discharge: 05/10/2023  Admitting diagnosis: Cesarean delivery delivered [O82] Term pregnancy [Z34.90] Intrauterine pregnancy: [redacted]w[redacted]d     Secondary diagnosis:  Principal Problem:   Cesarean delivery delivered Active Problems:   Term pregnancy  Additional problems: none    Discharge diagnosis: Term Pregnancy Delivered                                              Post partum procedures: none Augmentation: N/A Complications: None  Hospital course: Sceduled C/S   32 y.o. yo G2P2002 at [redacted]w[redacted]d was admitted to the hospital 05/08/2023 for scheduled cesarean section with the following indication:Elective Repeat.Delivery details are as follows:  Membrane Rupture Time/Date: 12:21 PM,05/08/2023  Delivery Method:C-Section, Vacuum Assisted Operative Delivery:N/A Details of operation can be found in separate operative note.  Patient had a postpartum course complicated by none.  She is ambulating, tolerating a regular diet, passing flatus, and urinating well. Patient is discharged home in stable condition on  05/10/23        Newborn Data: Birth date:05/08/2023 Birth time:12:23 PM Gender:Female Living status:Living Apgars:8 ,9  Weight:3380 g     Physical exam  Vitals:   05/09/23 0315 05/09/23 0619 05/09/23 1431 05/09/23 2123  BP:  121/63 111/71 116/65  Pulse:  62 84 81  Resp: 16 16 16 17   Temp:  97.8 F (36.6 C) 98.1 F (36.7 C) 98.9 F (37.2 C)  TempSrc:  Oral Oral Oral  SpO2: 96% 100% 100%   Weight:      Height:       General: alert, cooperative, and no distress Lochia: appropriate Uterine Fundus: firm Incision: Healing well with no significant drainage, No significant erythema, Dressing is clean, dry, and intact DVT Evaluation: No evidence of DVT seen on physical  exam. Negative Homan's sign. No cords or calf tenderness. Labs: Lab Results  Component Value Date   WBC 12.0 (H) 05/09/2023   HGB 9.0 (L) 05/09/2023   HCT 26.7 (L) 05/09/2023   MCV 82.4 05/09/2023   PLT 177 05/09/2023       No data to display         Edinburgh Score:    05/09/2023    3:15 AM  Edinburgh Postnatal Depression Scale Screening Tool  I have been able to laugh and see the funny side of things. 0  I have looked forward with enjoyment to things. 0  I have blamed myself unnecessarily when things went wrong. 1  I have been anxious or worried for no good reason. 0  I have felt scared or panicky for no good reason. 0  Things have been getting on top of me. 0  I have been so unhappy that I have had difficulty sleeping. 0  I have felt sad or miserable. 0  I have been so unhappy that I have been crying. 0  The thought of harming myself has occurred to me. 0  Edinburgh Postnatal Depression Scale Total 1      After visit meds:  Allergies as of 05/10/2023   No Known Allergies      Medication List     TAKE these medications    acetaminophen 325 MG tablet Commonly  known as: TYLENOL Take 650 mg by mouth every 6 (six) hours as needed for headache.   DHA OMEGA 3 PO Take 1 tablet by mouth daily.   ibuprofen 800 MG tablet Commonly known as: ADVIL Take 1 tablet (800 mg total) by mouth every 8 (eight) hours as needed.   PNV PO Take 1 tablet by mouth daily.         Discharge home in stable condition Infant Feeding: Breast Infant Disposition:home with mother Discharge instruction: per After Visit Summary and Postpartum booklet. Activity: Advance as tolerated. Pelvic rest for 6 weeks.  Diet: routine diet Anticipated Birth Control: Unsure Postpartum Appointment:6 weeks Follow up Visit: [facility]   05/10/2023 Carlisle Cater, MD

## 2023-05-10 NOTE — Lactation Note (Addendum)
This note was copied from a baby's chart. Lactation Consultation Note  Patient Name: Alexa Hudson [id number]'H Date: 05/10/2023 Age:34 hours Reason for consult: Follow-up assessment;Term;Infant weight loss (2 % weight loss) LC reviewed breast feeding D/C teaching and the [facility] resources.  Provided shells for reverse pressure to elongate the nipple / areola complex.  Previous LC identified lip- tie, LC offered to request and LC O.P appt and mom declined and preferred to wait.  Per mom has pump x2 and plans to pump one more time prior to D/C.   Maternal Data    Feeding Mother's Current Feeding Choice: Breast Milk and Formula Nipple Type: Slow - flow  LATCH Score - 8-9 scores    Lactation Tools Discussed/Used Tools: Pump;Shells Breast pump type: Double-Electric Breast Pump Pump Education: Milk Storage;Setup, frequency, and cleaning;Other (comment) (set up this am) Pumped volume:  (per mom getting some milk did not say the volume)  Interventions Interventions: Breast feeding basics reviewed;Reverse pressure;Shells;DEBP;Education;LC Services brochure  Discharge Discharge Education: Engorgement and breast care;Outpatient recommendation;Other (comment) (LC offered and per mom desired to wait and see ,) Pump: Personal;Manual;DEBP WIC Program: No  Consult Status Consult Status: Complete Date: 05/10/23    Kathrin Greathouse 05/10/2023, 10:42 AM

## 2023-05-10 NOTE — Progress Notes (Signed)
Subjective: Postpartum Day #2: Cesarean Delivery Patient reports improving incisional pain, tolerating PO, and no problems voiding.    Objective: Vital signs in last 24 hours: Temp:  [98.1 F (36.7 C)-98.9 F (37.2 C)] 98.9 F (37.2 C) (08/14 2123) Pulse Rate:  [81-84] 81 (08/14 2123) Resp:  [16-17] 17 (08/14 2123) BP: (111-116)/(65-71) 116/65 (08/14 2123) SpO2:  [100 %] 100 % (08/14 1431)  Physical Exam:  General: alert Lochia: appropriate Uterine Fundus: firm Incision: dressing C/D/I  Recent Labs    05/07/23 1000 05/09/23 0506  HGB 12.3 9.0*  HCT 37.3 26.7*    Assessment/Plan: Status post Cesarean section. Doing well postoperatively.  Continue current care, ambulate. Discharge home today, baby s/p circ yesterday.  Carlisle Cater, MD 05/10/2023, 9:33 AM

## 2023-06-05 ENCOUNTER — Telehealth (HOSPITAL_COMMUNITY): Payer: Self-pay | Admitting: *Deleted

## 2023-06-05 NOTE — Telephone Encounter (Signed)
06/05/2023  Name: Tisheena Bramel MRN: [id number] DOB: 04-30-1991  Reason for Call:  Transition of Care Hospital Discharge Call  Contact Status: Patient Contact Status: Complete  Language assistant needed: Interpreter Mode: Interpreter Not Needed        Follow-Up Questions: Do You Have Any Concerns About Your Health As You Heal From Delivery?: No Do You Have Any Concerns About Your Infants Health?: Yes What Concerns Do You Have About Your Baby?: Baby is having "sweat test" tomorrow to confirm/rule out CF.  Patient feels pediatrician is on top of this issue and guiding them well.  Edinburgh Postnatal Depression Scale:  In the Past 7 Days:    PHQ2-9 Depression Scale:     Discharge Follow-up: Edinburgh score requires follow up?:  (saw OB this morning and scored low on EPDS) Patient was advised of the following resources:: Breastfeeding Support Group, Support Group  Post-discharge interventions: Reviewed Newborn Safe Sleep Practices  Salena Saner, RN 06/05/2023 13:42
